# Patient Record
Sex: Male | Born: 1935 | Race: White | Hispanic: No | Marital: Married | Smoking: Never smoker
Health system: Southern US, Community
[De-identification: ages and names within clinical notes are randomized; demographics above are authoritative.]

## PROBLEM LIST (undated history)

## (undated) DIAGNOSIS — E079 Disorder of thyroid, unspecified: Secondary | ICD-10-CM

## (undated) DIAGNOSIS — C801 Malignant (primary) neoplasm, unspecified: Secondary | ICD-10-CM

## (undated) HISTORY — PX: THYROID SURGERY: SHX805

---

## 2000-05-09 ENCOUNTER — Encounter (INDEPENDENT_AMBULATORY_CARE_PROVIDER_SITE_OTHER): Payer: Self-pay | Admitting: Specialist

## 2000-05-09 ENCOUNTER — Other Ambulatory Visit: Admission: RE | Admit: 2000-05-09 | Discharge: 2000-05-09 | Payer: Self-pay | Admitting: Urology

## 2001-09-01 ENCOUNTER — Encounter (INDEPENDENT_AMBULATORY_CARE_PROVIDER_SITE_OTHER): Payer: Self-pay | Admitting: *Deleted

## 2001-09-01 ENCOUNTER — Inpatient Hospital Stay (HOSPITAL_COMMUNITY): Admission: EM | Admit: 2001-09-01 | Discharge: 2001-09-03 | Payer: Self-pay | Admitting: Emergency Medicine

## 2001-09-03 ENCOUNTER — Encounter (INDEPENDENT_AMBULATORY_CARE_PROVIDER_SITE_OTHER): Payer: Self-pay | Admitting: *Deleted

## 2002-05-24 ENCOUNTER — Encounter: Payer: Self-pay | Admitting: Ophthalmology

## 2002-05-28 ENCOUNTER — Ambulatory Visit (HOSPITAL_COMMUNITY): Admission: RE | Admit: 2002-05-28 | Discharge: 2002-05-28 | Payer: Self-pay | Admitting: Ophthalmology

## 2002-09-12 ENCOUNTER — Ambulatory Visit (HOSPITAL_COMMUNITY): Admission: RE | Admit: 2002-09-12 | Discharge: 2002-09-12 | Payer: Self-pay | Admitting: Internal Medicine

## 2004-07-06 ENCOUNTER — Inpatient Hospital Stay (HOSPITAL_COMMUNITY): Admission: RE | Admit: 2004-07-06 | Discharge: 2004-07-10 | Payer: Self-pay | Admitting: Urology

## 2004-07-06 ENCOUNTER — Encounter (INDEPENDENT_AMBULATORY_CARE_PROVIDER_SITE_OTHER): Payer: Self-pay | Admitting: *Deleted

## 2004-09-08 ENCOUNTER — Encounter: Admission: RE | Admit: 2004-09-08 | Discharge: 2004-09-08 | Payer: Self-pay | Admitting: Internal Medicine

## 2004-09-08 ENCOUNTER — Encounter (INDEPENDENT_AMBULATORY_CARE_PROVIDER_SITE_OTHER): Payer: Self-pay | Admitting: *Deleted

## 2004-09-10 ENCOUNTER — Ambulatory Visit: Payer: Self-pay | Admitting: Internal Medicine

## 2004-09-28 ENCOUNTER — Ambulatory Visit: Payer: Self-pay | Admitting: Internal Medicine

## 2005-06-09 ENCOUNTER — Encounter: Admission: RE | Admit: 2005-06-09 | Discharge: 2005-06-09 | Payer: Self-pay | Admitting: Internal Medicine

## 2005-06-09 ENCOUNTER — Encounter (INDEPENDENT_AMBULATORY_CARE_PROVIDER_SITE_OTHER): Payer: Self-pay | Admitting: *Deleted

## 2005-06-13 ENCOUNTER — Encounter: Admission: RE | Admit: 2005-06-13 | Discharge: 2005-06-13 | Payer: Self-pay | Admitting: Internal Medicine

## 2005-06-13 ENCOUNTER — Encounter (INDEPENDENT_AMBULATORY_CARE_PROVIDER_SITE_OTHER): Payer: Self-pay | Admitting: *Deleted

## 2009-08-21 ENCOUNTER — Encounter (INDEPENDENT_AMBULATORY_CARE_PROVIDER_SITE_OTHER): Payer: Self-pay | Admitting: *Deleted

## 2009-09-01 ENCOUNTER — Encounter (INDEPENDENT_AMBULATORY_CARE_PROVIDER_SITE_OTHER): Payer: Self-pay | Admitting: *Deleted

## 2009-10-06 DIAGNOSIS — K5730 Diverticulosis of large intestine without perforation or abscess without bleeding: Secondary | ICD-10-CM | POA: Insufficient documentation

## 2009-10-06 DIAGNOSIS — Z860100 Personal history of colon polyps, unspecified: Secondary | ICD-10-CM | POA: Insufficient documentation

## 2009-10-06 DIAGNOSIS — Z8719 Personal history of other diseases of the digestive system: Secondary | ICD-10-CM | POA: Insufficient documentation

## 2009-10-13 ENCOUNTER — Ambulatory Visit: Payer: Self-pay | Admitting: Internal Medicine

## 2009-10-15 ENCOUNTER — Ambulatory Visit: Payer: Self-pay | Admitting: Internal Medicine

## 2009-10-19 ENCOUNTER — Encounter: Payer: Self-pay | Admitting: Internal Medicine

## 2010-08-27 ENCOUNTER — Encounter
Admission: RE | Admit: 2010-08-27 | Discharge: 2010-08-27 | Payer: Self-pay | Source: Home / Self Care | Attending: Internal Medicine | Admitting: Internal Medicine

## 2010-09-16 NOTE — Procedures (Signed)
Summary: COLON   Colonoscopy  Procedure date:  09/28/2004  Findings:      Location:  [place].    Procedures Next Due Date:    Colonoscopy: 09/2007 Patient Name: Corey Small, Corey Small MRN: [id number] Procedure Procedures: Colonoscopy CPT: H7044205.    with polypectomy. CPT: S2983155.  Personnel: Endoscopist: Valma Rotenberg L. [name], MD.  Referred By: Lorene Dy, MD.  Exam Location: Exam performed in Outpatient Clinic. Outpatient  Patient Consent: Procedure, Alternatives, Risks and Benefits discussed, consent obtained, from patient. Consent was obtained by the RN.  Indications  Average Risk Screening Routine.  History  Current Medications: Patient is not currently taking Coumadin.  Pre-Exam Physical: Performed Sep 28, 2004. Entire physical exam was normal.  Exam Exam: Extent of exam reached: Cecum, extent intended: Cecum.  The cecum was identified by appendiceal orifice and IC valve. Colon retroflexion performed. Images taken. ASA Classification: II. Tolerance: good.  Monitoring: Pulse and BP monitoring, Oximetry used. Supplemental O2 given.  Colon Prep Used Miralax for colon prep. Prep results: good.  Sedation Meds: Patient assessed and found to be appropriate for moderate (conscious) sedation. Fentanyl 75 mcg. given IV. Versed 10 mg. given IV.  Findings - MULTIPLE POLYPS: Cecum to Hepatic Flexure. minimum size 6 mm, maximum size 9 mm. Procedure:  snare with cautery, removed, Polyp retrieved, 2 polyps Polyps sent to pathology. ICD9: Colon Polyps: 211.3.  - DIVERTICULOSIS: Sigmoid Colon. ICD9: Diverticulosis: 562.10. Comments: severe diveric ,no obstruction.  - NORMAL EXAM: Cecum.   Assessment Abnormal examination, see findings above.  Diagnoses: 211.3: Colon Polyps.  562.10: Diverticulosis.   Comments: s/p polypectomy times two Events  Unplanned Interventions: No intervention was required.  Unplanned Events: There were no  complications. Plans  Post Exam Instructions: No aspirin or non-steroidal containing medications: 2 weeks.  Medication Plan: Await pathology.  Patient Education: Patient given standard instructions for: Yearly hemoccult testing recommended. Patient instructed to get routine colonoscopy every 3 years.  Disposition: After procedure patient sent to recovery. After recovery patient sent home.   This report was created from the original endoscopy report, which was reviewed and signed by the above listed endoscopist.

## 2010-09-16 NOTE — Procedures (Signed)
Summary: Colonoscopy  Patient: [name] Note: All result statuses are Final unless otherwise noted.  Tests: (1) Colonoscopy (COL)   COL Colonoscopy           [facility]     [place], [place]  [address]           COLONOSCOPY PROCEDURE REPORT           PATIENT:  Corey Small, Corey Small  MR#:  [id number]     BIRTHDATE:  Oct 13, 1919, 89 yrs. old  GENDER:  male           ENDOSCOPIST:  Lowella Bandy. [name], MD     Referred by:  Lorene Dy, M.D.           PROCEDURE DATE:  10/15/2009     PROCEDURE:  Colonoscopy 45378     ASA CLASS:  Class III     INDICATIONS:  aden. polyp with high grade dysplasia 2006     no polyps in 2003 x diverticulosis           MEDICATIONS:   Versed 5 mg, Fentanyl 50 mcg           DESCRIPTION OF PROCEDURE:   After the risks benefits and     alternatives of the procedure were thoroughly explained, informed     consent was obtained.  Digital rectal exam was performed and     revealed no rectal masses.   The LB PCF-H180AL [id number] endoscope     was introduced through the anus and advanced to the cecum, which     was identified by both the appendix and ileocecal valve, without     limitations.  The quality of the prep was good, using MiraLax.     The instrument was then slowly withdrawn as the colon was fully     examined.     <<PROCEDUREIMAGES>>           FINDINGS:  A diminutive polyp was found in the sigmoid colon. at     30 cm small flat polyp The polyp was removed using cold biopsy     forceps (see image7).  Moderate diverticulosis was found     throughout the colon (see image6, image2, and image1).  This was     otherwise a normal examination of the colon (see image3, image4,     image5, and image8).   Retroflexed views in the rectum revealed no     abnormalities.    The scope was then withdrawn from the patient     and the procedure completed.           COMPLICATIONS:  None           ENDOSCOPIC IMPRESSION:     1)  Diminutive polyp in the sigmoid colon     2) Moderate diverticulosis throughout the colon     3) Otherwise normal examination     RECOMMENDATIONS:     1) Await pathology results     high fiber diet           REPEAT EXAM:  In 0 year(s) for.  no recall due to age           ______________________________     Lowella Bandy. [name], MD           CC:           n.     eSIGNED:   Lowella Bandy. [name]  at 10/15/2009 03:32 PM           Gearldine Bienenstock, [id number]  Note: An exclamation mark (!) indicates a result that was not dispersed into the flowsheet. Document Creation Date: 10/15/2009 3:32 PM _______________________________________________________________________  (1) Order result status: Final Collection or observation date-time: 10/15/2009 15:25 Requested date-time:  Receipt date-time:  Reported date-time:  Referring Physician:   Ordering Physician: Delfin Edis [phone]) Specimen Source:  Source: Tawanna Cooler Order Number: [id number] Lab site:

## 2010-09-16 NOTE — Op Note (Signed)
Summary: COLON                    [place]. [facility]  Patient:    Corey Small, Corey Small Visit Number: [id number] MRN: [id number]          Service Type: MED Location: [phone] Attending Physician:  Vincent Peyer Dictated by:   Lowella Bandy. [name], M.D. [facility] Proc. Date: 09/03/01 Admit Date:  09/01/2001 Discharge Date: 09/03/2001   CC:         Floyde Parkins, M.D.   Procedure Report  PROCEDURE: Colonoscopy.  ENDOSCOPIST: Lowella Bandy. [name], M.D.  INDICATIONS FOR PROCEDURE: This 77 year old gentleman was admitted two days ago with large volume hematochezia.  He has been on one aspirin a day and has no history of GI problems.  His hemoglobin initially was 16 and dropped down to 14.  He has not had any bleeding in the last 24 hours and was prepped for colonoscopy for further evaluation of the large volume hematochezia.  ENDOSCOPE: Fujinon single-channel video scope.  SEDATION: Versed 5 mg IV, Fentanyl 50 mg IV.  FINDINGS: The Fujinon single-channel video scope was passed under direct vision through the rectum to the sigmoid colon.  The patient was monitored by pulse oximetry and oxygen saturations were normal.  His prep was adequate. The anal canal and rectal ampulla was unremarkable.  Starting at 20 cm from the rectum was extensive diverticulosis with large hypertrophic folds, multiple diverticula.  One of them showed old blood impacted within the diverticulum.  There were also streaks of old blood in the stool that was left behind in the sigmoid colon.  No bright red blood was noted in the entire colon.  The splenic flexure, transverse colon, hepatic flexure, and ascending colon were free of any blood or any mucosal lesions.  The cecal pouch was normal.  The colonoscope was then retracted and the colon decompressed.  No other lesions were found.  IMPRESSION: Moderately severe diverticulosis of the left colon, status post suspected diverticular bleed from the  left colon.  PLAN:  1. Advance diet.  2. Follow H&H closely.  3. Follow-up with Dr. Mancel Bale.  4. The patient will be discharged and will start on Citrucel one teaspoonful     on a daily basis. Dictated by:   Lowella Bandy. [name], M.D. [place] Attending Physician:  Vincent Peyer DD:  09/03/01 TD:  09/04/01 Job: [id number]   This report was created from the original endoscopy report, which was reviewed and signed by the above listed endoscopist.

## 2010-09-16 NOTE — Letter (Signed)
Summary: [facility] Instructions  [facility]  [place], [place] [address]   Phone: [phone]  Fax: [phone]       Corey Small    [birth year]    MRN: [id number]       Procedure Day /Date: 10/15/09 (Thursday)     Arrival Time: 1:30 pm     Procedure Time: 2:30 pm     Location of Procedure:                    _x _  [place] (4th Floor)  [place]  Starting 5 days prior to your procedure (today) do not eat nuts, seeds, popcorn, corn, beans, peas,  salads, or any raw vegetables.  Do not take any fiber supplements (e.g. Metamucil, Citrucel, and Benefiber). ____________________________________________________________________________________________________   THE DAY BEFORE YOUR PROCEDURE         DATE: 3/2/11DAY: Wednesday  1   Drink clear liquids the entire day-NO SOLID FOOD  2   Do not drink anything colored red or purple.  Avoid juices with pulp.  No orange juice.  3   Drink at least 64 oz. (8 glasses) of fluid/clear liquids during th e day to prevent dehydration and help the prep work efficiently.  CLEAR LIQUIDS INCLUDE: Water Jello Ice Popsicles Tea (sugar ok, no milk/cream) Powdered fruit flavored drinks Coffee (sugar ok, no milk/cream) Gatorade Juice: apple, white grape, white cranberry  Lemonade Clear bullion, consomm, broth Carbonated beverages (any kind) Strained chicken noodle soup Hard Candy  4   Mix the entire bottle of Miralax with 64 oz. of Gatorade/Powerade in the morning and put in the refrigerator to chill.  5   At 3:00 pm take 2 Dulcolax/Bisacodyl tablets.  6   At 4:30 pm take one Reglan/Metoclopramide tablet.  7  Starting at 5:00 pm drink one 8 oz glass of the Miralax mixture every 15-20 minutes until you have finished drinking the entire 64 oz.  You should finish drinking prep around 7:30 or 8:00 pm.  8   If you are nauseated, you may take the 2nd Reglan/Metoclopramide  tablet at 6:30 pm.        9    At 8:00 pm take 2 more DULCOLAX/Bisacodyl tablets.        THE DAY OF YOUR PROCEDURE      DATE:  10/15/09 DAY: Wednesday  You may drink clear liquids until 12:30 pm  (2 HOURS BEFORE PROCEDURE).   MEDICATION INSTRUCTIONS  Unless otherwise instructed, you should take regular prescription medications with a small sip of water as early as possible the morning of your procedure.        OTHER INSTRUCTIONS  You will need a responsible adult at least 75 years of age to accompany you and drive you home.   This person must remain in the waiting room during your procedure.  Wear loose fitting clothing that is easily removed.  Leave jewelry and other valuables at home.  However, you may wish to bring a book to read or an iPod/MP3 player to listen to music as you wait for your procedure to start.  Remove all body piercing jewelry and leave at home.  Total time from sign-in until discharge is approximately 2-3 hours.  You should go home directly after your procedure and rest.  You can resume normal activities the day after your procedure.  The day of your procedure you should not:   Drive   Make  legal decisions   Operate machinery   Drink alcohol   Return to work  You will receive specific instructions about eating, activities and medications before you leave.   The above instructions have been reviewed and explained to me by  [place] Deborra Medina)  October 13, 2009 3:21 PM     I fully understand and can verbalize these instructions _____________________________ Date 10/13/09

## 2010-09-16 NOTE — Discharge Summary (Signed)
Summary: GI Bleed                    [place]. [facility]  Patient:    Corey Small, Corey Small Visit Number: [id number] MRN: [id number]          Service Type: MED Location: [phone] Attending Physician:  Vincent Peyer Dictated by:   Vena Rua, P.A. Admit Date:  09/01/2001 Discharge Date: 09/03/2001                             Discharge Summary  ADMISSION DIAGNOSES: 1. Acute lower gastrointestinal bleeding with bright red bleeding per rectum    for eight hours prior to admission. Rule out diverticular bleed, rule out    occult colonic neoplasm, rule out angiodysplasia of the colon. 2. History of prostate cancer diagnosed in 2002.  Followed by Einar Crow, M.D.  According to the patient, there has been no treatment for    the cancer, he is just being followed closely. 3. Status post appendectomy. 4. Status post umbilical hernia repair.  DISCHARGE DIAGNOSIS:  Acute lower gastrointestinal bleeding secondary to diverticulosis, predominantly involving the left colon.  This is felt to be the source for his bleeding.  Bleeding resolved during this hospitalization.  CONSULTATIONS:  None.  PROCEDURES:  Colonoscopy by Dr. Delfin Edis performed on September 03, 2001. This showed extensive diverticulosis involving the sigmoid colon and into the rectosigmoid region.  Old blood was seen within a particular diverticulum. There were no masses or polyps observed.  BRIEF HISTORY:  Corey Small is a pleasant, 77 year old white gentleman.  Dr. Lindaann Slough requested that GI Service admit the patient because of complaints of bright red bleeding per rectum which had begun about eight hours prior to arrival in the emergency room.  Dr. Larena Sox Collene Mares was covering for Dr. Delfin Edis and admitted the patient.  Apparently, the bleeding had started about 12 p.m. on the day of admission.  He had had a single episode of bleeding associated with clots and fresh  blood.  He did not have any associated nausea, vomiting, abdominal pain, fever or chills.  He has never seen rectal bleeding before.  The patient had no other constitutional symptoms suggestive of hemodynamic instability such as chest pain, shortness of breath, fatigue or weakness.  Dr. Collene Mares admitted the patient from the emergency room in stable condition with a blood pressure of 158/93 and a pulse of 86.  Initial hemoglobin in the ER was 16.2 with normal MCV and normal coags.  LABORATORY DATA:  Hemoglobin was 16.2 on admission, it declined to 14.6 on the day of discharge. Hematocrit went from 46.1 down to 41.4.  MCV was normal at 89.9.  Platelets ranged 182 and 211.  PT was 13.9, INR 1.1, PTT 35.  Sodium 137, potassium 4.0, chloride 108, CO2 24, glucose 130, BUN 20, creatinine 1.3. Total bilirubin 0.8, alkaline phosphatase 67, AST 21, ALT 21.  Albumin was 3.7.  HOSPITAL COURSE:  Within the first 24 hours of his admission the patient had not had any further bleeding and continued to be pain and nausea free.  By hospital day three, he had completed colonoscopy prep.  This was associated with passage of some old blood no further fresh bleeding.  He underwent colonoscopy with findings noted above.  Following the colonoscopy, he was felt stable for discharge and went home in the company  of his wife in stable condition.  Follow up plans for this patient were to see Dr. Mancel Bale for his usual annual physical exam.  He was advised not to use any aspirin for two weeks. Also added to his regimen were Citrucel one teaspoon mixed with liquid daily.  In two weeks, he would be allowed to start back on aspirin 81 mg p.o. q.d. Next, if he had any further bleeding problems he was to contact Dr. Josepha Pigg office or proceed to the emergency room.  Dictated by:   Vena Rua, P.A. Attending Physician:  Vincent Peyer DD:  09/21/01 TD:  09/23/01 Job: [id number]

## 2010-09-16 NOTE — Letter (Signed)
Summary: Colonoscopy Letter  [facility]  [place], [place] [address]   Phone: [phone]  Fax: [phone]      August 21, 2009 MRN: [id number]   Corey Small [place], [place]  [address]   Dear Mr. [name],   According to your medical record, it is time for you to schedule a Colonoscopy. The American Cancer Society recommends this procedure as a method to detect early colon cancer. Patients with a family history of colon cancer, or a personal history of colon polyps or inflammatory bowel disease are at increased risk.  This letter has beeen generated based on the recommendations made at the time of your procedure. If you feel that in your particular situation this may no longer apply, please contact our office.  Please call our office at [phone] to schedule this appointment or to update your records at your earliest convenience.  Thank you for cooperating with [place] to provide you with the very best care possible.   Sincerely,  Lowella Bandy. [name], M.D.  [facility] [phone]

## 2010-09-16 NOTE — Assessment & Plan Note (Signed)
Summary: consult before col pt age...em   History of Present Illness Visit Type: Initial Consult Primary GI MD: Delfin Edis MD Primary Provider: Cherlynn Polo Requesting Provider: Cherlynn Polo Chief Complaint: Consult for a colonoscopy , No GI complaints History of Present Illness:   This is an 77 year old white male who is due for a recall colonoscopy. He has a history of a tubular adenoma and another adenoma with focal high-grade dysplasia on a colonoscopy in February 2006. His first colonoscopy in January 2003 showed diverticulosis. There is a history of an acute lower GI bleed in January 2003 attributed to diverticula. Other medical problems include prostate cancer, horseshoe kidney stones, fatty liver and cholelithiasis.   GI Review of Systems      Denies abdominal pain, acid reflux, belching, bloating, chest pain, dysphagia with liquids, dysphagia with solids, heartburn, loss of appetite, nausea, vomiting, vomiting blood, weight loss, and  weight gain.        Denies anal fissure, black tarry stools, change in bowel habit, constipation, diarrhea, diverticulosis, fecal incontinence, heme positive stool, hemorrhoids, irritable bowel syndrome, jaundice, light color stool, liver problems, rectal bleeding, and  rectal pain.    Current Medications (verified): 1)  Avodart 0.5 Mg Caps (Dutasteride) .[facility] Kitchen.. 1 By Mouth Once Daily 2)  Triamterene-Hctz 50-25 Mg Caps (Triamterene-Hctz) .Marland Kitchen.. 1 By Mouth Once Daily 3)  Simvastatin 20 Mg Tabs (Simvastatin) .Marland Kitchen.. 1 By Mouth Once Daily 4)  Citrucel 500 Mg Tabs (Methylcellulose (Laxative)) .Marland Kitchen.. 1 By Mouth Once Daily 5)  Aspirin 81 Mg Tbec (Aspirin) .Marland Kitchen.. 1 By Mouth Once Daily 6)  Cephalexin 500 Mg Caps (Cephalexin) .Marland Kitchen.. 1 By Mouth Four Times A Day For 15 Days For Infection On Hand  Allergies (verified): No Known Drug Allergies  Past History:  Past Medical History: Current Problems:  GASTROINTESTINAL HEMORRHAGE, HX OF (ICD-V12.79) COLONIC  POLYPS, ADENOMATOUS, HX OF (ICD-V12.72) DIVERTICULOSIS OF COLON (ICD-562.10) Kidney Stones  Past Surgical History: Reviewed history from 10/06/2009 and no changes required. T & A appendectomy colon surgery for "kinked colon" age 9  Family History: Family History of Breast Cancer:mother No FH of Colon Cancer:  Social History: Illicit Drug Use - no married Occupation: retired 2 children Alcohol Use - no Patient has never smoked.  Smoking Status:  never  Review of Systems  The patient denies nausea, vomiting, hypoglycemia, palpitations, excessive diaphoresis, tremor, polyuria, erectile dysfunction, anxiety, fever, weight loss, weight gain, vision loss, hoarseness, chest pain, syncope, dyspnea on exertion, peripheral edema, prolonged cough, headaches, hemoptysis, abdominal pain, hematochezia, severe indigestion/heartburn, hematuria, incontinence, muscle weakness, suspicious skin lesions, depression, unusual weight change, angioedema, and breast masses.         Pertinent positive and negative review of systems were noted in the above HPI. All other ROS was otherwise negative.   Vital Signs:  Patient profile:   77 year old male Height:      71.5 inches Weight:      189 pounds BSA:     2.07 Pulse rate:   80 / minute Pulse rhythm:   irregular BP sitting:   118 / 78  (left arm)  Vitals Entered By: [place] Deborra Medina) (October 13, 2009 2:46 PM)  Physical Exam  General:  alert, oriented, hard of hearing. Eyes:  PERRLA, no icterus. Mouth:  No deformity or lesions, dentition normal. Neck:  Supple; no masses or thyromegaly. Lungs:  Clear throughout to auscultation. Heart:  quiet S1 and S2 with occasional premature beats. Abdomen:  soft abdomen with normoactive bowel  sounds. No tenderness. No palpable mass. No bruits. Liver edge at costal margin. Rectal:  normal rectal tone. 2+ prostate hard Hemoccult negative stool in large amount. Extremities:  No clubbing, cyanosis, edema  or deformities noted. Skin:  multiple actinic keratoses. Psych:  Alert and cooperative. Normal mood and affect.   Impression & Recommendations:  Problem # 1:  COLONIC POLYPS, ADENOMATOUS, HX OF (ICD-V12.72)  Patient had high-grade dysplasia in an adenomatous  colon polyp 5 years ago. She is hemoccult-negative on my exam today. He has constipation as indicated by a large amount of stool in the rectum. We will go ahead with a colonoscopy to rule out the possibility of recurrent polyps.  Orders: Colonoscopy (Colon)  Problem # 2:  GASTROINTESTINAL HEMORRHAGE, HX OF (ICD-V12.79)  Patient is status post diverticular bleed in 2003. She is currently Hemoccult negative.  Orders: Colonoscopy (Colon)  Patient Instructions: 1)  colonoscopy with MiraLax prep. Indications prep and sedation discussed with the patient. 2)  Copy sent to : Dr Suzan Garibaldi 3)  The medication list was reviewed and reconciled.  All changed / newly prescribed medications were explained.  A complete medication list was provided to the patient / caregiver. Prescriptions: DULCOLAX 5 MG  TBEC (BISACODYL) Day before procedure take 2 at 3pm and 2 at 8pm.  #4 x 0   Entered by:   Awilda Bill CMA ([place])   Authorized by:   [place] Dragon MD   Signed by:   Awilda Bill CMA ([place]) on 10/13/2009   Method used:   Electronically to        [facility] (retail[facility]       [address]       [place], [place]  [address]       Ph: [phone]       Fax: [phone]   RxID:   [id number] REGLAN 10 MG  TABS (METOCLOPRAMIDE HCL) As per prep instructions.  #2 x 0   Entered by:   Awilda Bill CMA (AAMA)   Authorized by:   Lafayette Dragon MD   Signed by:   Awilda Bill CMA ([place]) on 10/13/2009   Method used:   Electronically to        [facility] ([facility])       [address]       [place], [place]  [address]       Ph: [phone]       Fax: [phone]   RxID:    [id number] [place]   POWD (POLYETHYLENE GLYCOL 3350) As per prep  instructions.  #255gm x 0   Entered by:   Awilda Bill CMA ([place])   Authorized by:   Lafayette Dragon MD   Signed by:   Awilda Bill CMA ([place]) on 10/13/2009   Method used:   Electronically to        [facility] (retail)       [address]       [place], [place]  [address]       Ph: [phone]       Fax: [phone]   [place]:   [phone]

## 2010-09-16 NOTE — Letter (Signed)
Summary: New Patient letter  [facility]  [address] [place], [place] [address]   Phone: [phone]  Fax: [phone]       09/01/2009 MRN: [id number]  Corey Small [place], [place]  [address]  Dear Mr. Corey Small,  Welcome to the Gastroenterology Division at [facility].    You are scheduled to see Dr.  Delfin Edis on October 13, 2009 at 2:45pm on the 3rd floor at [facility].  We ask that you try to arrive at ouroffice 15 minutes prior to your appointment time to allow for check-in.  We would like you to complete the enclosed self-administered evaluation form prior to your visit and bring it with you on the day of your appointment.  We will review it with you.  Also, please bring a complete list of all your medications or, if you prefer, bring the medication bottles and we will list them.  Please bring your insurance card so that we may make a copy of it.  If your insurance requires a referral to see a specialist, please bring your referral form from your primary care physician.  Co-payments are due at the time of your visit and may be paid by cash, check or credit card.     Your office visit will consist of a consult with your physician (includes a physical exam), any laboratory testing he/she may order, scheduling of any necessary diagnostic testing (e.g. x-ray, ultrasound, CT-scan), and scheduling of a procedure (e.g. Endoscopy, Colonoscopy) if required.  Please allow enough time on your schedule to allow for any/all of these possibilities.    If you cannot keep your appointment, please call [phone] to cancel or reschedule prior to your appointment date.  This allows [place] the opportunity to schedule an appointment for another patient in need of care.  If you do not cancel or reschedule by 5 p.m. the business day prior to your appointment date, you will be charged a $50.00 late cancellation/no-show fee.    Thank you for  choosing [facility] for your medical needs.  We appreciate the opportunity to care for you.  Please visit [place] at our website  to learn more about our practice.                     Sincerely,                                                             The Gastroenterology Division

## 2010-09-16 NOTE — Letter (Signed)
Summary: Patient Notice- Polyp Results  [facility]  [address] [place], [place] [address]   Phone: [phone]  Fax: [phone]        October 19, 2009 MRN: [id number]    Corey Small [place], [place]  [address]    Dear Corey Small,  I am pleased to inform you that the colon polyp(s) removed during your recent colonoscopy was (were) found to be benign (no cancer detected) upon pathologic examination.The polyp was hyperplastic ( not precancerous)    Should you develop new or worsening symptoms of abdominal pain, bowel habit changes or bleeding from the rectum or bowels, please schedule an evaluation with either your primary care physician or with me.  Additional information/recommendations:  _x_ No further action with gastroenterology is needed at this time. Please      follow-up with your primary care physician for your other healthcare      needs.  __ Please call [phone] to schedule a return visit to review your      situation.  __ Please keep your follow-up visit as already scheduled.  __ Continue treatment plan as outlined the day of your exam.  Please call us if you are having persistent problems or have questions about your condition that have not been fully answered at this time.  Sincerely,  Lafayette Dragon MD  This letter has been electronically signed by your physician.  Appended Document: Patient Notice- Polyp Results Letter mailed 3.10.11

## 2010-12-31 NOTE — H&P (Signed)
[place]  Patient:    Corey Small, Corey Small Visit Number: [id number] MRN: [id number]          Service Type: MED Location: [phone] Attending Physician:  Vincent Peyer Dictated by:   Nelwyn Salisbury, M.D. Admit Date:  09/01/2001   CC:         Floyde Parkins, M.D.  Einar Crow, M.D.   History and Physical  DATE OF BIRTH:  19-Jun-2020  CHIEF COMPLAINT:  Bright red bleeding per rectum x 8 hours.  HISTORY OF PRESENT ILLNESS:  Mr. Corey Small is a very pleasant, 77 year old, white male admitted to the GI service at the request of Lindaann Slough, M.D., when he called him with complaints of bright red bleeding per rectum for the last eight hours.  The patient was in his usual state of health.  He was sitting on his couch at about 12 p.m. when he noticed a wet discharge from his rectum.  When he went to the bathroom, he saw some fresh clots and every few hours noticed the passage of clots without any abdominal pain, nausea, vomiting, fever, chills, or rigors.  He has never had symptoms like this before.  There was no history of syncope or near syncopal events, dizziness, fatigue, weakness, shortness of breath, chest pain, or palpitations.  The patient was concerned about the symptoms because of his previous diagnosis of prostate cancer.  He presented to the hospital for further evaluation.  The patient denies a history of any genitourinary complaints, except for a history of prostate cancer diagnosed about a year ago by Einar Crow, M.D.  According to the patient, no treatment was undertaken.  Plans were to watch him closely.  The patient claims that he had a sigmoidoscopy done by Floyde Parkins, M.D., five years ago, which he says was normal and no abnormalities were noticed.  There are no ENT, dental, or ocular complaints.  ALLERGIES:  No known drug allergies.  MEDICATIONS:  Baby aspirin one per  day.  PAST MEDICAL HISTORY:  Prostate cancer diagnosed a year ago.  PAST SURGICAL HISTORY:  Status post appendectomy and umbilical hernia repair as a teenager.  SOCIAL HISTORY:  The patient is married and lives with his wife in [place], [place].  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  He denies a history of alcohol, tobacco, or drug use.  FAMILY HISTORY:  His father committed suicide at 89.  His mother died of "liver cancer" at 84.  He has no other siblings.  The patient denies a known family history of prostate or colon cancer.  REVIEW OF SYSTEMS:  BRBPR without any abdominal pain, nausea, or vomiting.  PHYSICAL EXAMINATION:  An elderly white male lying comfortably on the stretcher in no acute distress.  VITAL SIGNS:  Blood pressure 158/93, pulse 86 per minute, respiratory rate 20, temperature 97.2 degrees.  HEENT:  PERRLA.  Facial symmetry preserved.  The patient is wearing glasses. Oropharyngeal mucosa without exudate.  NECK:  Supple.  No JVD, thyromegaly, or lymphadenopathy.  CHEST:  Clear to auscultation.  No rhonchi, rales, or wheezes.  HEART:  Regular rate and rhythm without murmurs, rubs, or gallops.  ABDOMEN:  Soft, nondistended, and nontender with normal bowel sounds.  A small surgical scar in the right lower quadrant and the umbilicus from the above-mentioned surgeries.  There is no evidence of hepatosplenomegaly.  RECTAL:  Moderate sphincter tone with a large amount of  fresh blood on the examining finger.  No other abnormalities are appreciated.  LABORATORY DATA ON ADMISSION:  Hemoglobin 16.6.  The CBC was otherwise normal. Sodium 137, potassium 4, chloride 108, CO2 24, BUN 20, creatinine 1.3, calcium 8.7, total protein 6, albumin 3.7, AST 21, ALT 21, alkaline phosphatase 68, total bilirubin 0.8.  The PT and PTT were within normal limits.  ASSESSMENT AND PLAN:  Acute lower gastrointestinal bleeding.  Question diverticular bleed.   The patient is being admitted to the hospital for serial CBCs.  He will be monitored closely and a colonoscopy done early next week. If the patient has significant GI hemorrhage prior to completion of his colonoscopy, a bleeding scan with an angiogram will be considered.  Aspirin will be held for now.  The patient will be maintained on ice chips and further recommendations made as the need arises. Dictated by:   Nelwyn Salisbury, M.D. Attending Physician:  Vincent Peyer DD:  09/02/01 TD:  09/02/01 Job: [id number]

## 2010-12-31 NOTE — Discharge Summary (Signed)
[place]  Patient:    Corey Small, Corey Small Visit Number: [id number] MRN: [id number]          Service Type: MED Location: [phone] Attending Physician:  Vincent Peyer Dictated by:   Vena Rua, P.A. Admit Date:  09/01/2001 Discharge Date: 09/03/2001                             Discharge Summary  ADMISSION DIAGNOSES: 1. Acute lower gastrointestinal bleeding with bright red bleeding per rectum    for eight hours prior to admission. Rule out diverticular bleed, rule out    occult colonic neoplasm, rule out angiodysplasia of the colon. 2. History of prostate cancer diagnosed in 2002.  Followed by Einar Crow, M.D.  According to the patient, there has been no treatment for    the cancer, he is just being followed closely. 3. Status post appendectomy. 4. Status post umbilical hernia repair.  DISCHARGE DIAGNOSIS:  Acute lower gastrointestinal bleeding secondary to diverticulosis, predominantly involving the left colon.  This is felt to be the source for his bleeding.  Bleeding resolved during this hospitalization.  CONSULTATIONS:  None.  PROCEDURES:  Colonoscopy by Dr. Delfin Edis performed on September 03, 2001. This showed extensive diverticulosis involving the sigmoid colon and into the rectosigmoid region.  Old blood was seen within a particular diverticulum. There were no masses or polyps observed.  BRIEF HISTORY:  Mr. Corey Small is a pleasant, 77 year old white gentleman.  Dr. Lindaann Slough requested that GI Service admit the patient because of complaints of bright red bleeding per rectum which had begun about eight hours prior to arrival in the emergency room.  Dr. Larena Sox Collene Mares was covering for Dr. Delfin Edis and admitted the patient.  Apparently, the bleeding had started about 12 p.m. on the day of admission.  He had had a single episode of bleeding associated with clots and fresh blood.  He did not have  any associated nausea, vomiting, abdominal pain, fever or chills.  He has never seen rectal bleeding before.  The patient had no other constitutional symptoms suggestive of hemodynamic instability such as chest pain, shortness of breath, fatigue or weakness.  Dr. Collene Mares admitted the patient from the emergency room in stable condition with a blood pressure of 158/93 and a pulse of 86.  Initial hemoglobin in the ER was 16.2 with normal MCV and normal coags.  LABORATORY DATA:  Hemoglobin was 16.2 on admission, it declined to 14.6 on the day of discharge. Hematocrit went from 46.1 down to 41.4.  MCV was normal at 89.9.  Platelets ranged 182 and 211.  PT was 13.9, INR 1.1, PTT 35.  Sodium 137, potassium 4.0, chloride 108, CO2 24, glucose 130, BUN 20, creatinine 1.3. Total bilirubin 0.8, alkaline phosphatase 67, AST 21, ALT 21.  Albumin was 3.7.  HOSPITAL COURSE:  Within the first 24 hours of his admission the patient had not had any further bleeding and continued to be pain and nausea free.  By hospital day three, he had completed colonoscopy prep.  This was associated with passage of some old blood no further fresh bleeding.  He underwent colonoscopy with findings noted above.  Following the colonoscopy, he was felt stable for discharge and went home in the company of his wife in stable condition.  Follow up plans for this patient were to see Dr. Mancel Bale for his usual annual  physical exam.  He was advised not to use any aspirin for two weeks. Also added to his regimen were Citrucel one teaspoon mixed with liquid daily.  In two weeks, he would be allowed to start back on aspirin 81 mg p.o. q.d. Next, if he had any further bleeding problems he was to contact Dr. Josepha Pigg office or proceed to the emergency room.  Dictated by:   Vena Rua, P.A. Attending Physician:  Vincent Peyer DD:  09/21/01 TD:  09/23/01 Job: [id number]

## 2010-12-31 NOTE — Op Note (Signed)
NAME:  Corey Small, Corey Small                         ACCOUNT NO.:  [id number]   MEDICAL RECORD NO.:  [id number]                   PATIENT TYPE:  OIB   LOCATION:                                       FACILITY:  [facility]   PHYSICIAN:  Garey Ham, M.D.             DATE OF BIRTH:  01-19-1920   DATE OF PROCEDURE:  05/28/2002  DATE OF DISCHARGE:                                 OPERATIVE REPORT   PREOPERATIVE DIAGNOSIS:  Senile cataract, left eye.   POSTOPERATIVE DIAGNOSIS:  Senile cataract, left eye.   NAME OF OPERATION:  Planned extracapsular cataract extraction -  phacoemulsification, primary insertion of posterior chamber intraocular lens  implant.   SURGEON:  Garey Ham, M.D.   ASSISTANT:  Nurse.   ANESTHESIA:  Local 4% Xylocaine, 0.75 % Marcaine retrobulbar with Wydase  added.  Anesthesia standby required in this elderly patient.  The patient  was given sodium Pentothal intravenously during the period of retrobulbar  injection.   DESCRIPTION OF PROCEDURE:  After the patient was prepped and draped a lid  speculum was inserted in the left eye.  The eye was turned downward and a  superior rectus traction suture placed.  Schiotz tonometry was recorded at 5-  7 scale units with a 5.5 gram weight.  A peritomy was performed adjacent to  the limbus from the 11 o'clock to 1 o'clock position.  The corneoscleral  junction was cleaned and a corneoscleral groove made with a 45-degree  Superblade.  The anterior chamber was then entered with a 2.5 mm diamond  keratome at the 12 o'clock position and the 15-degree blade at the 2:30  position.  Using a bent 26-gauge needle on a Healon syringe, a circular  capsulorhexis was begun and then completed with the Grabow forceps.  Hydrodissection and hydrodelineation were performed using 1% Xylocaine.  A  30-degree phakoemulsification tip was then inserted with slow, controlled  emulsification of the lens nucleus.  Total ultrasonic time:  One  minute one  second; average power level:  14%; total amount of fluid used:  40 mL.  Following removal of the nucleus the residual cortex was aspirated with the  irrigation aspiration tip.  The posterior capsule appeared intact with a  brilliant red fundus reflex.  It was therefore elected to insert an [employer] [id number] silicon three-piece posterior intraocular lens implant  with UB absorber, diopter strength +19.50.  This was inserted with the  McDonald forceps into the anterior chamber and then centered into the  capsular bag using the [facility] lens rotator.  The lens appeared to be well  centered.  The Healon which had been used throughout the procedure was  aspirated and replaced with balanced salt solution and Miochol ophthalmic  solution.  The operative incisions appeared to be self-sealing and no  sutures were required.  Maxitrol ointment was instilled in the conjunctival  cul-de-sac and a  light patch and protector shield applied.  Duration of  procedure and anesthesia administration was 45 minutes.  The patient  tolerated the procedure well in general, left the operating room for the  recovery room in good condition.                                               Garey Ham, M.D.    HNJ/MEDQ  D:  09/30/2002  T:  09/30/2002  Job:  [id number]

## 2010-12-31 NOTE — H&P (Signed)
NAME:  Corey Small, Corey Small                         ACCOUNT NO.:  [id number]   MEDICAL RECORD NO.:  [id number]                   PATIENT TYPE:  OIB   LOCATION:                                       FACILITY:  [facility]   PHYSICIAN:  Garey Ham, M.D.             DATE OF BIRTH:  1919-10-10   DATE OF ADMISSION:  05/28/2002  DATE OF DISCHARGE:                                HISTORY & PHYSICAL   REASON FOR ADMISSION:  This was a planned outpatient surgical admission of  this 77 year old white male admitted for cataract implant surgery of the  left eye.   PRESENT ILLNESS:  This patient has been followed in my office since  April 18, 2002.  At that time the patient stated he had poor vision and  very dim vision.  He had been previously told that he had a cataract of the  left eye.  Initial examination in my office revealed a visual acuity of  20/30 +2 right eye, 20/60 left eye without correction; and 20/30 +3 right  eye, 20/20 -2 left eye with correction.  Applanation tonometry 19 mm each  eye.  Slit lamp examination confirmed the presence of a dense white cloudy  cataract in the left eye despite his good vision.  Glare testing revealed a  visual acuity of 20/150 right eye, 20/400 left eye, indicating intolerance  to glare and/or poor night vision.  A scan ultrasonography was performed in  anticipation of surgery, considering the patient's complaints of blurred,  smoky, and dim vision; difficulty with driving, seeing road signs;  difficulty with television; difficulty with reading, night vision, bright  sunlight; inability to do work or housework, hobbies; colors appeared dim  and dull; and depth perception difficult or poor.  The patient was given  oral discussion and printed information concerning cataract surgery and its  complications.  He signed an informed consent and arrangements were made for  his outpatient admission.   PAST MEDICAL HISTORY:  The patient is under the care of  Dr. Janeice Robinson.  Has a history of prostate cancer, stabilized, and is in at the present good  general health, taking only 81 mg aspirin a day and Citracal.   REVIEW OF SYSTEMS:  No cardiorespiratory complaints.   PHYSICAL EXAMINATION:  VITAL SIGNS AS RECORDED ON ADMISSION:  Blood pressure  155/86, temperature 97.4, pulse 60, respirations 16.  GENERAL APPEARANCE:  The patient is a pleasant, well-nourished, well-  developed 77 year old white male in no acute distress.  HEENT:  Eyes:  Ocular exam as noted above.  Cataract formation greater in  the left than right eye.  Fundus examination normal.  Applanation tonometry  normal.  CHEST:  Lungs clear to percussion and auscultation.  HEART:  Normal sinus rhythm, no cardiomegaly, no murmurs.  ABDOMEN:  Negative.  EXTREMITIES:  Negative.   ADMISSION DIAGNOSIS:  Senile cataract, left eye.   SURGICAL PLAN:  Cataract implant surgery, left eye.                                               Garey Ham, M.D.   HNJ/MEDQ  D:  09/30/2002  T:  09/30/2002  Job:  [id number]

## 2010-12-31 NOTE — Procedures (Signed)
[place]  Patient:    Corey Small, HIDAY Visit Number: [id number] MRN: [id number]          Service Type: MED Location: [phone] Attending Physician:  Vincent Peyer Dictated by:   Lowella Bandy. [name], M.D. [facility] Proc. Date: 09/03/01 Admit Date:  09/01/2001 Discharge Date: 09/03/2001   CC:         Floyde Parkins, M.D.   Procedure Report  PROCEDURE: Colonoscopy.  ENDOSCOPIST: Lowella Bandy. [name], M.D.  INDICATIONS FOR PROCEDURE: This 77 year old gentleman was admitted two days ago with large volume hematochezia.  He has been on one aspirin a day and has no history of GI problems.  His hemoglobin initially was 16 and dropped down to 14.  He has not had any bleeding in the last 24 hours and was prepped for colonoscopy for further evaluation of the large volume hematochezia.  ENDOSCOPE: Fujinon single-channel video scope.  SEDATION: Versed 5 mg IV, Fentanyl 50 mg IV.  FINDINGS: The Fujinon single-channel video scope was passed under direct vision through the rectum to the sigmoid colon.  The patient was monitored by pulse oximetry and oxygen saturations were normal.  His prep was adequate. The anal canal and rectal ampulla was unremarkable.  Starting at 20 cm from the rectum was extensive diverticulosis with large hypertrophic folds, multiple diverticula.  One of them showed old blood impacted within the diverticulum.  There were also streaks of old blood in the stool that was left behind in the sigmoid colon.  No bright red blood was noted in the entire colon.  The splenic flexure, transverse colon, hepatic flexure, and ascending colon were free of any blood or any mucosal lesions.  The cecal pouch was normal.  The colonoscope was then retracted and the colon decompressed.  No other lesions were found.  IMPRESSION: Moderately severe diverticulosis of the left colon, status post suspected diverticular bleed from the left  colon.  PLAN:  1. Advance diet.  2. Follow H&H closely.  3. Follow-up with Dr. Mancel Bale.  4. The patient will be discharged and will start on Citrucel one teaspoonful     on a daily basis. Dictated by:   Lowella Bandy. [name], M.D. [place] Attending Physician:  Vincent Peyer DD:  09/03/01 TD:  09/04/01 Job: [id number]

## 2010-12-31 NOTE — Op Note (Signed)
NAME:  Corey Small, Corey Small NO.:  [id number]   MEDICAL RECORD NO.:  [id number]          PATIENT TYPE:  INP   LOCATION:  X005                         FACILITY:  [facility]   PHYSICIAN:  Nelida Gores, M.D.DATE OF BIRTH:  1920-02-06   DATE OF PROCEDURE:  07/06/2004  DATE OF DISCHARGE:                                 OPERATIVE REPORT   PREOPERATIVE DIAGNOSIS:  Low volume prostate cancer, mild obstructive  uropathy with bladder calculi.   POSTOPERATIVE DIAGNOSIS:  Low volume prostate cancer, mild obstructive  uropathy with bladder calculi.   PROCEDURE:  Cystoscopy, cystolithopexy, dilation of urethral stricture, and  placement of Foley catheter.   ANESTHESIA:  General.   DRAINS:  A 20 French Foley.   SPECIMENS:  Bladder stones.   COMPLICATIONS:  None.   DESCRIPTION OF PROCEDURE:  Patient was prepped and draped in the dorsal  lithotomy position after institution of an adequate level of general  anesthesia.  A well-lubricated 21 French pan endoscope was gently inserted  at the urethral meatus.  Normal penile urethra.  Some narrowing of the  bulbous urethra, which would, with careful manipulation admit the 21 French  cystoscope.  Prostatic urethra showed coapting lateral lobes.  Bladder  showed trabeculation with at least 6-8 stones within the bladder.  With  gentle irrigation over a period of probably 15-20 minutes, all stones were  irrigated from the bladder.  A 21 French cystoscope was then removed.  Attempts were made to dilate the urethra to allow passage of the 27 French  continuous-flow resectoscope sheath.  Narrowing was encountered along the  bulbous urethra, making passage to the [employer] sounds difficult.  Once  narrowed area had been dilated, the pan-endoscope was reinserted.  It was  felt that placement of a the 27 [place] continuous flow sheath might cause  some additional trauma to the bulbous urethra, and in order to avoid any  chance of stricture, the  cystoscope was removed and replaced with a 20  French council-tip catheter, which passed easily into the bladder.  Then 10  cc and a 5 cc ballon was left to straight.  There was some bleeding around  the catheter at the urethral meatus, but blood from the bladder was clear.  The catheter was left at light traction.  Patient was returned to recovery  in satisfactory condition.      RH/MEDQ  D:  07/06/2004  T:  07/06/2004  Job:  [id number]   cc:   Dr. Mancel Bale

## 2010-12-31 NOTE — Discharge Summary (Signed)
NAME:  DERWARD, FRISELLA NO.:  [id number]   MEDICAL RECORD NO.:  [id number]          PATIENT TYPE:  INP   LOCATION:  0369                         FACILITY:  [facility]   PHYSICIAN:  Nelida Gores, M.D.DATE OF BIRTH:  April 02, 1920   DATE OF ADMISSION:  07/06/2004  DATE OF DISCHARGE:  07/10/2004                                 DISCHARGE SUMMARY   Indications, medications, allergies, tobacco, ETOH, past medical history,  social history, physical exam, and review of systems all outlined in the  admitting note.   HOSPITAL COURSE:  The patient was admitted July 06, 2004, underwent  cystoscopy, cystolithopaxy, dilation of urethral stricture, and 20 [place]  Council-tip catheter placement.  The patient had a prolonged but pleasantly  uneventful postoperative recovery.  Urine was pink without clots on postop  day one.  Initial voiding trial July 08, 2004, the patient unable to  void, had some problems with constipation which may be related to previous  symptoms in that regard and patient's age.  Decision made on July 10, 2004, once bowel habits had returned to normal, to discharge patient home  with Foley catheter with follow up office visit July 12, 2004.  The  patient felt that he would do better with a office voiding trial.  Discharge medications included Macrobid and Vicodin.      RH/MEDQ  D:  08/09/2004  T:  08/09/2004  Job:  [id number]

## 2010-12-31 NOTE — H&P (Signed)
NAME:  LAQUANN, ISHO NO.:  [id number]   MEDICAL RECORD NO.:  [id number]          PATIENT TYPE:  INP   LOCATION:  X005                         FACILITY:  [facility]   PHYSICIAN:  Nelida Gores, M.D.DATE OF BIRTH:  04-Oct-1919   DATE OF ADMISSION:  07/06/2004  DATE OF DISCHARGE:                                HISTORY & PHYSICAL   An 77 year old male admitted for a cystolithopaxy and probable TURP.  These  are the notable points of the recent past medical history.   1.  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  PSA at that time was 6.6.  Careful follow-up of his      PSA over the last four to five years shows a range from 6.6 to 5.3 to      6.3 to 4.6 to 5.0 to 4.4 to 5.3 to 6.1.  Rectal exam shows subtle      induration in the left lobe of the prostate, but overall conservative      therapy seems to be working well.  2.  Progressive symptoms of urinary hesitancy, onset of hematuria, probable      hemorrhagic cystitis.  Cystoscopy June 22, 2004, six or perhaps eight      stones within the bladder.  The bladder shows no evidence of tumor or      other obvious abnormality.  There are coapting lateral lobes of the      prostate.  CT scan was performed June 22, 2004.  Findings included:      Shrunken gallbladder, multiple gallstones, horseshoe kidney, a 38 mm      stone within a calyceal diverticulum, a 34 mm cyst midportion of the      patient's horseshoe left kidney.  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  Discussed the situation with      patient and family members.  Am inclined to proceed with evacuation of      stones from the bladder and TURP if possible.  If not, would consider      medical therapy to shrink the  prostate, as the patient has had fairly      successful course over the last four to five years with watchful waiting      as regards his low-volume prostate cancer.   MEDICATIONS:  Citrucel two p.o. q.a.m.  The patient discontinued aspirin  about a week ago.   ALLERGIES:  Denies.   Tobacco:  Denies.  ETOH:  Denies.   PAST MEDICAL HISTORY:  1.  Umbilical hernia repair.  2.  Appendectomy.  3.  Symptomatic diverticulosis, January 2003, with GI bleed.  4.  Left cataract, October 2003.  5.  The patient has been followed with low-volume prostate cancer for four      to five years, watchful  waiting, with no significant change in his      PSA.   PHYSICAL EXAMINATION:  GENERAL:  A healthy, active, and alert 77 year old  male appearing younger than his stated age.  VITAL SIGNS:  Temperature 98, heart rate 64, respirations 20, BP 140/70.  Height 70 inches, weight 202 pounds.  HEENT:  Negative adenopathy or bruit on HEENT.  CHEST:  Lungs show slightly increased AP diameter with decreased breath  sounds at base, otherwise clear to P&A.  CARDIAC:  Regular rate and rhythm without murmur or gallop.  ABDOMEN:  Soft, protuberant, positive bowel sounds.  A well-healed  transverse appendectomy incision.  Well-healed umbilical hernia.  The  patient does have palpable inguinal hernias that are small and at present  cause him no significant discomfort.  Abdominal exam otherwise no  organomegaly or mass.  GENITOURINARY:  Penis without lesions, easily retractable foreskin.  Bilaterally descended, palpably atrophic testes.  RECTAL:  Induration within the left lobe.  NEUROLOGIC:  Grossly intact.   PLAN:  Will proceed this a.m.      RH/MEDQ  D:  07/06/2004  T:  07/06/2004  Job:  [id number]   cc:   Floyde Parkins, M.D.  [address], [place]  [place] [address]  Fax: [phone]

## 2010-12-31 NOTE — H&P (Signed)
   NAME:  Corey Small, Corey Small NO.:  [id number]   MEDICAL RECORD NO.:  [id number]                   PATIENT TYPE:  OIB   LOCATION:  2874                                 FACILITY:  [facility]   PHYSICIAN:  Garey Ham, M.D.             DATE OF BIRTH:  03/04/1929   DATE OF ADMISSION:  05/28/2002  DATE OF DISCHARGE:  05/28/2002                                HISTORY & PHYSICAL   DATE OF OUTPATIENT SURGERY:  05/28/2002.   This was a planned, outpatient surgical admission of this 77 year old, white  male, admitted for cataract implant surgery of the left eye.   The patient was first seen in my office in 04/18/02.  The patient stated at  that time that he had cataract in his left eye.  Examination revealed a  dense, cloudy, white cataract in the left eye, although the vision was  recorded without correction at 20/30 right eye, 20/60 left eye; with  correction 20/20 right eye, 20/30 left eye.  Glare testing; however,  revealed marked reduction to 20/400 in the left eye, accounting for the  patient's subjective symptoms.  It was felt that the patient had an almost  mature cataract with good vision due to his anterior subcapsular cataract  and nuclear cataract location.  It was felt feasible to proceed with  cataract surgery.  The patient was given oral discussion and printed  information concerning the procedure and its possible complications.  He  signed an informed consent and arrangements were made for his outpatient  admission at this time.   PAST MEDICAL HISTORY:  The patient is in stable general health, under the  care of Dr. Janeice Robinson.   CURRENT MEDICATIONS:  Include 81 mg of aspirin a day and Citrucel.   He is said to have had previous prostate cancer which is now stabilized.   PHYSICAL EXAMINATION:  VITAL SIGNS:  Blood pressure 155/86, temperature  97.4, pulse 60, respirations 16.  GENERAL APPEARANCE:  Patient is a pleasant, well-nourished,  well-developed,  77 year old white male in no acute distress.  HEENT:  Eyes, ocular exam is as noted above.  CHEST:  Lungs clear to percussion and auscultation.  HEART:  Normal sinus rhythm, no cardiomegaly, no murmurs.  ABDOMEN:  Negative.  EXTREMITIES:  Negative.   ADMISSION DIAGNOSIS:  Senile cataract, anterior subcapsular type, left eye.   SURGICAL PLAN:  Cataract implant surgery, left eye.                                               Garey Ham, M.D.    HNJ/MEDQ  D:  07/09/2002  T:  07/09/2002  Job:  [id number]

## 2013-08-16 ENCOUNTER — Emergency Department (HOSPITAL_BASED_OUTPATIENT_CLINIC_OR_DEPARTMENT_OTHER): Payer: Medicare Other

## 2013-08-16 ENCOUNTER — Encounter (HOSPITAL_BASED_OUTPATIENT_CLINIC_OR_DEPARTMENT_OTHER): Payer: Self-pay | Admitting: Emergency Medicine

## 2013-08-16 ENCOUNTER — Emergency Department (HOSPITAL_BASED_OUTPATIENT_CLINIC_OR_DEPARTMENT_OTHER)
Admission: EM | Admit: 2013-08-16 | Discharge: 2013-08-16 | Disposition: A | Discharge status: Home / Self Care | Payer: Medicare Other | Attending: Emergency Medicine | Admitting: Emergency Medicine

## 2013-08-16 DIAGNOSIS — J209 Acute bronchitis, unspecified: Secondary | ICD-10-CM | POA: Insufficient documentation

## 2013-08-16 DIAGNOSIS — Z862 Personal history of diseases of the blood and blood-forming organs and certain disorders involving the immune mechanism: Secondary | ICD-10-CM | POA: Insufficient documentation

## 2013-08-16 DIAGNOSIS — Z859 Personal history of malignant neoplasm, unspecified: Secondary | ICD-10-CM | POA: Insufficient documentation

## 2013-08-16 DIAGNOSIS — J029 Acute pharyngitis, unspecified: Secondary | ICD-10-CM | POA: Insufficient documentation

## 2013-08-16 DIAGNOSIS — Z7982 Long term (current) use of aspirin: Secondary | ICD-10-CM | POA: Insufficient documentation

## 2013-08-16 DIAGNOSIS — Z8639 Personal history of other endocrine, nutritional and metabolic disease: Secondary | ICD-10-CM | POA: Insufficient documentation

## 2013-08-16 DIAGNOSIS — Z79899 Other long term (current) drug therapy: Secondary | ICD-10-CM | POA: Insufficient documentation

## 2013-08-16 DIAGNOSIS — J3489 Other specified disorders of nose and nasal sinuses: Secondary | ICD-10-CM | POA: Insufficient documentation

## 2013-08-16 DIAGNOSIS — I10 Essential (primary) hypertension: Secondary | ICD-10-CM | POA: Insufficient documentation

## 2013-08-16 HISTORY — DX: Disorder of thyroid, unspecified: E07.9

## 2013-08-16 HISTORY — DX: Malignant (primary) neoplasm, unspecified: C80.1

## 2013-08-16 LAB — CBC WITH DIFFERENTIAL/PLATELET
Basophils Absolute: 0 10*3/uL (ref 0.0–0.1)
Basophils Relative: 0 % (ref 0–1)
Eosinophils Absolute: 0 10*3/uL (ref 0.0–0.7)
Eosinophils Relative: 0 % (ref 0–5)
HCT: 42.8 % (ref 39.0–52.0)
Hemoglobin: 14.3 g/dL (ref 13.0–17.0)
Lymphocytes Relative: 6 % — ABNORMAL LOW (ref 12–46)
Lymphs Abs: 0.5 10*3/uL — ABNORMAL LOW (ref 0.7–4.0)
MCH: 31.7 pg (ref 26.0–34.0)
MCHC: 33.4 g/dL (ref 30.0–36.0)
MCV: 94.9 fL (ref 78.0–100.0)
MONO ABS: 1.2 10*3/uL — AB (ref 0.1–1.0)
Monocytes Relative: 13 % — ABNORMAL HIGH (ref 3–12)
Neutro Abs: 7.3 10*3/uL (ref 1.7–7.7)
Neutrophils Relative %: 81 % — ABNORMAL HIGH (ref 43–77)
PLATELETS: 118 10*3/uL — AB (ref 150–400)
RBC: 4.51 MIL/uL (ref 4.22–5.81)
RDW: 14.1 % (ref 11.5–15.5)
WBC: 9 10*3/uL (ref 4.0–10.5)

## 2013-08-16 LAB — COMPREHENSIVE METABOLIC PANEL
ALT: 24 U/L (ref 0–53)
AST: 30 U/L (ref 0–37)
Albumin: 3.5 g/dL (ref 3.5–5.2)
Alkaline Phosphatase: 72 U/L (ref 39–117)
BUN: 42 mg/dL — AB (ref 6–23)
CO2: 22 meq/L (ref 19–32)
CREATININE: 2 mg/dL — AB (ref 0.50–1.35)
Calcium: 8.6 mg/dL (ref 8.4–10.5)
Chloride: 102 mEq/L (ref 96–112)
GFR calc Af Amer: 32 mL/min — ABNORMAL LOW (ref >90)
GFR, EST NON AFRICAN AMERICAN: 27 mL/min — AB (ref >90)
Glucose, Bld: 145 mg/dL — ABNORMAL HIGH (ref 70–99)
Potassium: 4.7 mEq/L (ref 3.7–5.3)
Sodium: 140 mEq/L (ref 137–147)
Total Bilirubin: 1.3 mg/dL — ABNORMAL HIGH (ref 0.3–1.2)
Total Protein: 6.3 g/dL (ref 6.0–8.3)

## 2013-08-16 MED ORDER — ACETAMINOPHEN 325 MG PO TABS
650.0000 mg | ORAL_TABLET | Freq: Once | ORAL | Status: AC
  Administered 2013-08-16: 650 mg via ORAL
  Filled 2013-08-16: qty 2

## 2013-08-16 MED ORDER — AZITHROMYCIN 250 MG PO TABS: ORAL_TABLET | ORAL | Status: DC

## 2013-08-16 NOTE — ED Provider Notes (Signed)
CSN: [id number]     Arrival date & time 08/16/13  [id number] History   First MD Initiated Contact with Patient 08/16/13 1005     Chief Complaint  Patient presents with  . Cough  . Nasal Congestion  . Sore Throat   (Consider location/radiation/quality/duration/timing/severity/associated sxs/prior Treatment) HPI Comments: Patient is a 80 year old male with history of hypertension. He is a resident of a retirement community. He presents with complaints of a three-day history of fever, cough, and chest congestion. He states he has been coughing up some phlegm. He denies chest pain or leg swelling. He did receive a flu immunization this year.  Patient is a 80 y.o. male presenting with cough. The history is provided by the patient.  Cough Cough characteristics:  Productive Sputum characteristics:  Clear and rusty Severity:  Moderate Onset quality:  Gradual Duration:  3 days Timing:  Constant Progression:  Worsening Chronicity:  New Relieved by:  Nothing Worsened by:  Nothing tried Ineffective treatments:  None tried   Past Medical History  Diagnosis Date  . Thyroid disease   . Cancer    Past Surgical History  Procedure Laterality Date  . Thyroid surgery     No family history on file. History  Substance Use Topics  . Smoking status: Never Smoker   . Smokeless tobacco: Not on file  . Alcohol Use: Yes    Review of Systems  Respiratory: Positive for cough.   All other systems reviewed and are negative.    Allergies  Review of patient's allergies indicates no known allergies.  Home Medications   Current Outpatient Rx  Name  Route  Sig  Dispense  Refill  . aspirin 81 MG tablet   Oral   Take 81 mg by mouth daily.         Marland Kitchen dutasteride (AVODART) 0.5 MG capsule   Oral   Take 0.5 mg by mouth daily.         . hydrochlorothiazide (MICROZIDE) 12.5 MG capsule   Oral   Take 12.5 mg by mouth daily.         Marland Kitchen LISINOPRIL PO   Oral   Take by mouth.         .  simvastatin (ZOCOR) 20 MG tablet   Oral   Take 20 mg by mouth daily.         . TRIAMTERENE PO   Oral   Take by mouth.          BP 108/39  Pulse 56  Temp(Src) 101.2 F (38.4 C) (Rectal)  Resp 22  Ht 5' 11.5" (1.816 m)  Wt 183 lb (83.008 kg)  BMI 25.17 kg/m2  SpO2 93% Physical Exam  Nursing note and vitals reviewed. Constitutional: He is oriented to person, place, and time. He appears well-developed and well-nourished. No distress.  HENT:  Head: Normocephalic and atraumatic.  Mouth/Throat: Oropharynx is clear and moist.  Neck: Normal range of motion. Neck supple.  Cardiovascular: Normal rate, regular rhythm and normal heart sounds.   No murmur heard. Pulmonary/Chest: Effort normal and breath sounds normal. No respiratory distress. He has no wheezes.  Abdominal: Soft. Bowel sounds are normal. He exhibits no distension. There is no tenderness.  Musculoskeletal: Normal range of motion. He exhibits no edema.  Lymphadenopathy:    He has no cervical adenopathy.  Neurological: He is alert and oriented to person, place, and time.  Skin: Skin is warm and dry. He is not diaphoretic.    ED Course  Procedures (including  critical care time) Labs Review Labs Reviewed  CBC WITH DIFFERENTIAL  COMPREHENSIVE METABOLIC PANEL   Imaging Review No results found.  EKG Interpretation    Date/Time:  Friday August 16 2013 09:58:18 EST Ventricular Rate:  67 PR Interval:  180 QRS Duration: 112 QT Interval:  392 QTC Calculation: 414 R Axis:   17 Text Interpretation:  Sinus rhythm with Premature atrial complexes Low voltage QRS Borderline ECG Confirmed by DELOS  MD, Eugene Isadore ([id number]) on 08/16/2013 11:44:31 AM            MDM  No diagnosis found. Patient is a 80 year old male from a retirement community. He presents with cough and chest congestion for the past 3 days. This will be influenza, however he is outside of the window for Tamiflu. His chest x-ray is unremarkable and does not  suggest pneumonia. I will treat with Zithromax for suspected bronchitis. Is not hypoxic and appeared clinically well. At this point I feel as though he is stable for discharge. He understands to return if he develops difficulty breathing, chest pain, or other new or concerning symptoms.    Veryl Speak, MD 08/16/13 1146

## 2013-08-16 NOTE — ED Notes (Signed)
Pt brought from [place] at [facility] via [facility]. Pt c/o cough, congestion, fever and sore throat.

## 2013-08-16 NOTE — ED Notes (Signed)
MD at bedside. 

## 2013-08-16 NOTE — ED Notes (Signed)
Pt chief complaint is sore throat x 3 days.

## 2013-08-16 NOTE — Discharge Instructions (Signed)
Tylenol 650 mg every 6 hours as needed for fever.  Zithromax as prescribed.  Return to the emergency department if you develop chest pain, difficulty breathing, high fever, or other new or concerning symptoms   Acute Bronchitis Bronchitis is inflammation of the airways that extend from the windpipe into the lungs (bronchi). The inflammation often causes mucus to develop. This leads to a cough, which is the most common symptom of bronchitis.  In acute bronchitis, the condition usually develops suddenly and goes away over time, usually in a couple weeks. Smoking, allergies, and asthma can make bronchitis worse. Repeated episodes of bronchitis may cause further lung problems.  CAUSES Acute bronchitis is most often caused by the same virus that causes a cold. The virus can spread from person to person (contagious).  SIGNS AND SYMPTOMS   Cough.   Fever.   Coughing up mucus.   Body aches.   Chest congestion.   Chills.   Shortness of breath.   Sore throat.  DIAGNOSIS  Acute bronchitis is usually diagnosed through a physical exam. Tests, such as chest X-rays, are sometimes done to rule out other conditions.  TREATMENT  Acute bronchitis usually goes away in a couple weeks. Often times, no medical treatment is necessary. Medicines are sometimes given for relief of fever or cough. Antibiotics are usually not needed but may be prescribed in certain situations. In some cases, an inhaler may be recommended to help reduce shortness of breath and control the cough. A cool mist vaporizer may also be used to help thin bronchial secretions and make it easier to clear the chest.  HOME CARE INSTRUCTIONS  Get plenty of rest.   Drink enough fluids to keep your urine clear or pale yellow (unless you have a medical condition that requires fluid restriction). Increasing fluids may help thin your secretions and will prevent dehydration.   Only take over-the-counter or prescription medicines as  directed by your health care provider.   Avoid smoking and secondhand smoke. Exposure to cigarette smoke or irritating chemicals will make bronchitis worse. If you are a smoker, consider using nicotine gum or skin patches to help control withdrawal symptoms. Quitting smoking will help your lungs heal faster.   Reduce the chances of another bout of acute bronchitis by washing your hands frequently, avoiding people with cold symptoms, and trying not to touch your hands to your mouth, nose, or eyes.   Follow up with your health care provider as directed.  SEEK MEDICAL CARE IF: Your symptoms do not improve after 1 week of treatment.  SEEK IMMEDIATE MEDICAL CARE IF:  You develop an increased fever or chills.   You have chest pain.   You have severe shortness of breath.  You have bloody sputum.   You develop dehydration.  You develop fainting.  You develop repeated vomiting.  You develop a severe headache. MAKE SURE YOU:   Understand these instructions.  Will watch your condition.  Will get help right away if you are not doing well or get worse. Document Released: 09/08/2004 Document Revised: 04/03/2013 Document Reviewed: 01/22/2013 [facility] Patient Information 2014 [place].

## 2014-02-13 ENCOUNTER — Other Ambulatory Visit: Payer: Self-pay | Admitting: Dermatology

## 2017-06-12 ENCOUNTER — Other Ambulatory Visit: Payer: Self-pay | Admitting: Dermatology

## 2018-05-07 ENCOUNTER — Other Ambulatory Visit: Payer: Self-pay | Admitting: Dermatology

## 2018-07-15 DEATH — deceased

## 2019-04-05 ENCOUNTER — Emergency Department (HOSPITAL_COMMUNITY): Payer: Medicare Other

## 2019-04-05 ENCOUNTER — Other Ambulatory Visit: Payer: Self-pay

## 2019-04-05 ENCOUNTER — Inpatient Hospital Stay (HOSPITAL_COMMUNITY)
Admission: EM | Admit: 2019-04-05 | Discharge: 2019-04-10 | DRG: 683 | Disposition: A | Discharge status: Skilled Nursing Facility | Payer: Medicare Other | Source: Skilled Nursing Facility | Attending: Internal Medicine | Admitting: Internal Medicine

## 2019-04-05 DIAGNOSIS — T464X5A Adverse effect of angiotensin-converting-enzyme inhibitors, initial encounter: Secondary | ICD-10-CM | POA: Diagnosis present

## 2019-04-05 DIAGNOSIS — L89152 Pressure ulcer of sacral region, stage 2: Secondary | ICD-10-CM | POA: Diagnosis present

## 2019-04-05 DIAGNOSIS — D62 Acute posthemorrhagic anemia: Secondary | ICD-10-CM | POA: Diagnosis present

## 2019-04-05 DIAGNOSIS — E860 Dehydration: Secondary | ICD-10-CM | POA: Diagnosis present

## 2019-04-05 DIAGNOSIS — E785 Hyperlipidemia, unspecified: Secondary | ICD-10-CM | POA: Diagnosis present

## 2019-04-05 DIAGNOSIS — N179 Acute kidney failure, unspecified: Secondary | ICD-10-CM | POA: Diagnosis not present

## 2019-04-05 DIAGNOSIS — Z8546 Personal history of malignant neoplasm of prostate: Secondary | ICD-10-CM

## 2019-04-05 DIAGNOSIS — Z7982 Long term (current) use of aspirin: Secondary | ICD-10-CM

## 2019-04-05 DIAGNOSIS — I4892 Unspecified atrial flutter: Secondary | ICD-10-CM | POA: Diagnosis present

## 2019-04-05 DIAGNOSIS — I129 Hypertensive chronic kidney disease with stage 1 through stage 4 chronic kidney disease, or unspecified chronic kidney disease: Secondary | ICD-10-CM | POA: Diagnosis present

## 2019-04-05 DIAGNOSIS — I4891 Unspecified atrial fibrillation: Secondary | ICD-10-CM | POA: Diagnosis present

## 2019-04-05 DIAGNOSIS — I10 Essential (primary) hypertension: Secondary | ICD-10-CM | POA: Diagnosis present

## 2019-04-05 DIAGNOSIS — N200 Calculus of kidney: Secondary | ICD-10-CM | POA: Diagnosis present

## 2019-04-05 DIAGNOSIS — I483 Typical atrial flutter: Secondary | ICD-10-CM | POA: Diagnosis present

## 2019-04-05 DIAGNOSIS — R310 Gross hematuria: Secondary | ICD-10-CM | POA: Diagnosis present

## 2019-04-05 DIAGNOSIS — L8990 Pressure ulcer of unspecified site, unspecified stage: Secondary | ICD-10-CM | POA: Insufficient documentation

## 2019-04-05 DIAGNOSIS — Z66 Do not resuscitate: Secondary | ICD-10-CM | POA: Diagnosis present

## 2019-04-05 DIAGNOSIS — Z20828 Contact with and (suspected) exposure to other viral communicable diseases: Secondary | ICD-10-CM | POA: Diagnosis present

## 2019-04-05 DIAGNOSIS — I952 Hypotension due to drugs: Secondary | ICD-10-CM | POA: Diagnosis present

## 2019-04-05 DIAGNOSIS — N184 Chronic kidney disease, stage 4 (severe): Secondary | ICD-10-CM | POA: Diagnosis present

## 2019-04-05 LAB — CBC WITH DIFFERENTIAL/PLATELET
Abs Immature Granulocytes: 0.03 10*3/uL (ref 0.00–0.07)
Basophils Absolute: 0.1 10*3/uL (ref 0.0–0.1)
Basophils Relative: 1 %
Eosinophils Absolute: 0.1 10*3/uL (ref 0.0–0.5)
Eosinophils Relative: 1 %
HCT: 44.4 % (ref 39.0–52.0)
Hemoglobin: 14.9 g/dL (ref 13.0–17.0)
Immature Granulocytes: 0 %
Lymphocytes Relative: 15 %
Lymphs Abs: 1.3 10*3/uL (ref 0.7–4.0)
MCH: 32.4 pg (ref 26.0–34.0)
MCHC: 33.6 g/dL (ref 30.0–36.0)
MCV: 96.5 fL (ref 80.0–100.0)
Monocytes Absolute: 0.9 10*3/uL (ref 0.1–1.0)
Monocytes Relative: 11 %
Neutro Abs: 6.1 10*3/uL (ref 1.7–7.7)
Neutrophils Relative %: 72 %
Platelets: 182 10*3/uL (ref 150–400)
RBC: 4.6 MIL/uL (ref 4.22–5.81)
RDW: 13.5 % (ref 11.5–15.5)
WBC: 8.5 10*3/uL (ref 4.0–10.5)
nRBC: 0 % (ref 0.0–0.2)

## 2019-04-05 LAB — BASIC METABOLIC PANEL
Anion gap: 11 (ref 5–15)
BUN: 68 mg/dL — ABNORMAL HIGH (ref 8–23)
CO2: 20 mmol/L — ABNORMAL LOW (ref 22–32)
Calcium: 8.5 mg/dL — ABNORMAL LOW (ref 8.9–10.3)
Chloride: 110 mmol/L (ref 98–111)
Creatinine, Ser: 3.34 mg/dL — ABNORMAL HIGH (ref 0.61–1.24)
GFR calc Af Amer: 17 mL/min — ABNORMAL LOW (ref >60)
GFR calc non Af Amer: 15 mL/min — ABNORMAL LOW (ref >60)
Glucose, Bld: 124 mg/dL — ABNORMAL HIGH (ref 70–99)
Potassium: 5.2 mmol/L — ABNORMAL HIGH (ref 3.5–5.1)
Sodium: 141 mmol/L (ref 135–145)

## 2019-04-05 LAB — TROPONIN I (HIGH SENSITIVITY)
Troponin I (High Sensitivity): 39 ng/L — ABNORMAL HIGH (ref <18)
Troponin I (High Sensitivity): 44 ng/L — ABNORMAL HIGH (ref <18)

## 2019-04-05 LAB — BRAIN NATRIURETIC PEPTIDE: B Natriuretic Peptide: 65.4 pg/mL (ref 0.0–100.0)

## 2019-04-05 LAB — MAGNESIUM: Magnesium: 2.2 mg/dL (ref 1.7–2.4)

## 2019-04-05 MED ORDER — SODIUM CHLORIDE 0.9 % IV SOLN
Freq: Once | INTRAVENOUS | Status: AC
  Administered 2019-04-05: 20:00:00 via INTRAVENOUS

## 2019-04-05 NOTE — ED Provider Notes (Addendum)
[facility] EMERGENCY DEPARTMENT Provider Note   CSN: [id number] Arrival date & time: 04/05/19  1753     History   Chief Complaint Chief Complaint  Patient presents with  . Dizziness    HPI Corey Small is a 85 y.o. male.  Presents the ER with 3 days of lightheadedness, increased weakness.  States has not had any episodes of passing out, no chest pain or difficulty breathing.  States he has had increased generalized fatigue, decreased energy, is unable to do his regular daily activities as easily.  Denies any history of atrial fibrillation, atrial flutter.     HPI  Past Medical History:  Diagnosis Date  . Cancer   . Thyroid disease     Patient Active Problem List   Diagnosis Date Noted  . DIVERTICULOSIS OF COLON 10/06/2009  . COLONIC POLYPS, ADENOMATOUS, HX OF 10/06/2009  . GASTROINTESTINAL HEMORRHAGE, HX OF 10/06/2009    Past Surgical History:  Procedure Laterality Date  . THYROID SURGERY          Home Medications    Prior to Admission medications   Medication Sig Start Date End Date Taking? Authorizing Provider  aspirin 81 MG tablet Take 81 mg by mouth daily.    [provider]  azithromycin (ZITHROMAX Z-PAK) 250 MG tablet 2 po day one, then 1 daily x 4 days 08/16/13   Veryl Speak, MD  dutasteride (AVODART) 0.5 MG capsule Take 0.5 mg by mouth daily.    [provider]  hydrochlorothiazide (MICROZIDE) 12.5 MG capsule Take 12.5 mg by mouth daily.    [provider]  LISINOPRIL PO Take by mouth.    [provider]  simvastatin (ZOCOR) 20 MG tablet Take 20 mg by mouth daily.    [provider]  TRIAMTERENE PO Take by mouth.    [provider]    Family History No family history on file.  Social History Social History   Tobacco Use  . Smoking status: Never Smoker  Substance Use Topics  . Alcohol use: Yes  . Drug use: No     Allergies   Patient has no known allergies.    Review of Systems Review of Systems  Constitutional: Positive for fatigue. Negative for chills and fever.  HENT: Negative for ear pain and sore throat.   Eyes: Negative for pain and visual disturbance.  Respiratory: Negative for cough and shortness of breath.   Cardiovascular: Positive for palpitations. Negative for chest pain.  Gastrointestinal: Negative for abdominal pain and vomiting.  Genitourinary: Negative for dysuria and hematuria.  Musculoskeletal: Negative for arthralgias and back pain.  Skin: Negative for color change and rash.  Neurological: Negative for seizures and syncope.  All other systems reviewed and are negative.    Physical Exam Updated Vital Signs Temp 98.9 F (37.2 C) (Oral)   Resp 11   Physical Exam Vitals signs and nursing note reviewed.  Constitutional:      Appearance: He is well-developed.  HENT:     Head: Normocephalic and atraumatic.  Eyes:     Conjunctiva/sclera: Conjunctivae normal.  Neck:     Musculoskeletal: Neck supple.  Cardiovascular:     Pulses: Normal pulses.     Heart sounds: No murmur.     Comments: Regularly irregular Pulmonary:     Effort: Pulmonary effort is normal. No respiratory distress.     Breath sounds: Normal breath sounds.  Abdominal:     Palpations: Abdomen is soft.  Tenderness: There is no abdominal tenderness.  Skin:    General: Skin is warm and dry.     Capillary Refill: Capillary refill takes less than 2 seconds.  Neurological:     General: No focal deficit present.     Mental Status: He is alert.      ED Treatments / Results  Labs (all labs ordered are listed, but only abnormal results are displayed) Labs Reviewed  CBC WITH DIFFERENTIAL/PLATELET  BASIC METABOLIC PANEL  MAGNESIUM  BRAIN NATRIURETIC PEPTIDE  TROPONIN I (HIGH SENSITIVITY)    EKG None April 04, 1806 atrial flutter with variable AV block, no significant ST segment changes, ventricular rate 78  Radiology No results found.   Procedures Procedures (including critical care time)  Medications Ordered in ED Medications - No data to display   Initial Impression / Assessment and Plan / ED Course  I have reviewed the triage vital signs and the nursing notes.  Pertinent labs & imaging results that were available during my care of the patient were reviewed by me and considered in my medical decision making (see chart for details).  Clinical Course as of Apr 05 2127  Fri Apr 05, 2019  1901 completed   [RD]    Clinical Course User Index [RD] Lucrezia Starch, MD       85 year old male presented to the ER with generalized weakness, found to be in new onset atrial flutter, variable conduction, mostly 3-1, ventricular rate remained relatively reasonable in 70s to 80s.  Blood pressure remained stable while in the department.  Consulted cardiology, given patient is at reasonable rate, no acute intervention needed right now.  They recommended getting echocardiogram as inpatient.  Labs concerning for acute kidney injury.  Started on fluids.  Will admit to hospitalist service for further management.  Dr. Hal Hope will admit to tele bed.  Final Clinical Impressions(s) / ED Diagnoses   Final diagnoses:  AKI (acute kidney injury) [facility])  Atrial flutter, unspecified type [facility])    ED Discharge Orders    None       Lucrezia Starch, MD 04/05/19 2131    Lucrezia Starch, MD 04/05/19 2133

## 2019-04-05 NOTE — ED Notes (Addendum)
Paged admitting MD due to pt's high HR in a flutter, reading as high as 180 on monitor but does not seem accurate due to peripheral pulse check being 100. Will continue to monitor

## 2019-04-05 NOTE — ED Notes (Signed)
Report given to 6E RN. All questions answered 

## 2019-04-05 NOTE — ED Triage Notes (Signed)
Pt presents from [facility] with EMS for dizziness and near-syncope x4 days that is worse with ambulation. EMS arrived and noted 3:1 atrial flutter on EKG with no past history of this.  BP 90/60, improved to 126/82 with 500 cc NS.  Takes lisinopril, simvastatin, triamterene, dutateride.

## 2019-04-06 ENCOUNTER — Encounter (HOSPITAL_COMMUNITY): Payer: Self-pay | Admitting: Internal Medicine

## 2019-04-06 ENCOUNTER — Observation Stay (HOSPITAL_COMMUNITY): Payer: Medicare Other

## 2019-04-06 DIAGNOSIS — I129 Hypertensive chronic kidney disease with stage 1 through stage 4 chronic kidney disease, or unspecified chronic kidney disease: Secondary | ICD-10-CM | POA: Diagnosis present

## 2019-04-06 DIAGNOSIS — Z8546 Personal history of malignant neoplasm of prostate: Secondary | ICD-10-CM | POA: Diagnosis not present

## 2019-04-06 DIAGNOSIS — N183 Chronic kidney disease, stage 3 (moderate): Secondary | ICD-10-CM | POA: Diagnosis not present

## 2019-04-06 DIAGNOSIS — R310 Gross hematuria: Secondary | ICD-10-CM | POA: Diagnosis present

## 2019-04-06 DIAGNOSIS — I480 Paroxysmal atrial fibrillation: Secondary | ICD-10-CM | POA: Diagnosis not present

## 2019-04-06 DIAGNOSIS — N184 Chronic kidney disease, stage 4 (severe): Secondary | ICD-10-CM | POA: Diagnosis present

## 2019-04-06 DIAGNOSIS — I10 Essential (primary) hypertension: Secondary | ICD-10-CM | POA: Diagnosis present

## 2019-04-06 DIAGNOSIS — D62 Acute posthemorrhagic anemia: Secondary | ICD-10-CM | POA: Diagnosis present

## 2019-04-06 DIAGNOSIS — R001 Bradycardia, unspecified: Secondary | ICD-10-CM | POA: Diagnosis not present

## 2019-04-06 DIAGNOSIS — E785 Hyperlipidemia, unspecified: Secondary | ICD-10-CM | POA: Diagnosis present

## 2019-04-06 DIAGNOSIS — N179 Acute kidney failure, unspecified: Secondary | ICD-10-CM | POA: Diagnosis present

## 2019-04-06 DIAGNOSIS — I952 Hypotension due to drugs: Secondary | ICD-10-CM | POA: Diagnosis present

## 2019-04-06 DIAGNOSIS — N200 Calculus of kidney: Secondary | ICD-10-CM | POA: Diagnosis present

## 2019-04-06 DIAGNOSIS — I4892 Unspecified atrial flutter: Secondary | ICD-10-CM | POA: Diagnosis not present

## 2019-04-06 DIAGNOSIS — Z20828 Contact with and (suspected) exposure to other viral communicable diseases: Secondary | ICD-10-CM | POA: Diagnosis present

## 2019-04-06 DIAGNOSIS — T464X5A Adverse effect of angiotensin-converting-enzyme inhibitors, initial encounter: Secondary | ICD-10-CM | POA: Diagnosis present

## 2019-04-06 DIAGNOSIS — I483 Typical atrial flutter: Secondary | ICD-10-CM | POA: Diagnosis present

## 2019-04-06 DIAGNOSIS — L89152 Pressure ulcer of sacral region, stage 2: Secondary | ICD-10-CM | POA: Diagnosis present

## 2019-04-06 DIAGNOSIS — E860 Dehydration: Secondary | ICD-10-CM | POA: Diagnosis present

## 2019-04-06 DIAGNOSIS — I4891 Unspecified atrial fibrillation: Secondary | ICD-10-CM | POA: Diagnosis present

## 2019-04-06 DIAGNOSIS — Z7982 Long term (current) use of aspirin: Secondary | ICD-10-CM | POA: Diagnosis not present

## 2019-04-06 DIAGNOSIS — Z66 Do not resuscitate: Secondary | ICD-10-CM | POA: Diagnosis present

## 2019-04-06 LAB — CBC WITH DIFFERENTIAL/PLATELET
Abs Immature Granulocytes: 0.04 10*3/uL (ref 0.00–0.07)
Basophils Absolute: 0.1 10*3/uL (ref 0.0–0.1)
Basophils Relative: 1 %
Eosinophils Absolute: 0.1 10*3/uL (ref 0.0–0.5)
Eosinophils Relative: 1 %
HCT: 42.6 % (ref 39.0–52.0)
Hemoglobin: 14.4 g/dL (ref 13.0–17.0)
Immature Granulocytes: 1 %
Lymphocytes Relative: 11 %
Lymphs Abs: 0.9 10*3/uL (ref 0.7–4.0)
MCH: 32.1 pg (ref 26.0–34.0)
MCHC: 33.8 g/dL (ref 30.0–36.0)
MCV: 95.1 fL (ref 80.0–100.0)
Monocytes Absolute: 0.7 10*3/uL (ref 0.1–1.0)
Monocytes Relative: 9 %
Neutro Abs: 6.7 10*3/uL (ref 1.7–7.7)
Neutrophils Relative %: 77 %
Platelets: 185 10*3/uL (ref 150–400)
RBC: 4.48 MIL/uL (ref 4.22–5.81)
RDW: 13.2 % (ref 11.5–15.5)
WBC: 8.5 10*3/uL (ref 4.0–10.5)
nRBC: 0 % (ref 0.0–0.2)

## 2019-04-06 LAB — COMPREHENSIVE METABOLIC PANEL
ALT: 16 U/L (ref 0–44)
AST: 19 U/L (ref 15–41)
Albumin: 3.3 g/dL — ABNORMAL LOW (ref 3.5–5.0)
Alkaline Phosphatase: 67 U/L (ref 38–126)
Anion gap: 10 (ref 5–15)
BUN: 64 mg/dL — ABNORMAL HIGH (ref 8–23)
CO2: 21 mmol/L — ABNORMAL LOW (ref 22–32)
Calcium: 8.4 mg/dL — ABNORMAL LOW (ref 8.9–10.3)
Chloride: 108 mmol/L (ref 98–111)
Creatinine, Ser: 3.05 mg/dL — ABNORMAL HIGH (ref 0.61–1.24)
GFR calc Af Amer: 19 mL/min — ABNORMAL LOW (ref >60)
GFR calc non Af Amer: 16 mL/min — ABNORMAL LOW (ref >60)
Glucose, Bld: 117 mg/dL — ABNORMAL HIGH (ref 70–99)
Potassium: 4 mmol/L (ref 3.5–5.1)
Sodium: 139 mmol/L (ref 135–145)
Total Bilirubin: 2 mg/dL — ABNORMAL HIGH (ref 0.3–1.2)
Total Protein: 5.5 g/dL — ABNORMAL LOW (ref 6.5–8.1)

## 2019-04-06 LAB — SARS CORONAVIRUS 2 (TAT 6-24 HRS): SARS Coronavirus 2: NEGATIVE

## 2019-04-06 LAB — HEPARIN LEVEL (UNFRACTIONATED)
Heparin Unfractionated: 0.62 IU/mL (ref 0.30–0.70)
Heparin Unfractionated: 0.89 IU/mL — ABNORMAL HIGH (ref 0.30–0.70)

## 2019-04-06 LAB — ECHOCARDIOGRAM COMPLETE
Height: 71 in
Weight: 2320 oz

## 2019-04-06 LAB — TSH: TSH: 1.278 u[IU]/mL (ref 0.350–4.500)

## 2019-04-06 LAB — GLUCOSE, CAPILLARY: Glucose-Capillary: 122 mg/dL — ABNORMAL HIGH (ref 70–99)

## 2019-04-06 MED ORDER — ONDANSETRON HCL 4 MG/2ML IJ SOLN: 4.0000 mg | Freq: Four times a day (QID) | INTRAMUSCULAR | Status: DC | PRN

## 2019-04-06 MED ORDER — ASPIRIN EC 81 MG PO TBEC
81.0000 mg | DELAYED_RELEASE_TABLET | Freq: Every day | ORAL | Status: DC
  Administered 2019-04-06 – 2019-04-08 (×3): 81 mg via ORAL
  Filled 2019-04-06 (×3): qty 1

## 2019-04-06 MED ORDER — HEPARIN (PORCINE) 25000 UT/250ML-% IV SOLN
850.0000 [IU]/h | INTRAVENOUS | Status: DC
  Administered 2019-04-06: 900 [IU]/h via INTRAVENOUS
  Administered 2019-04-06: 1100 [IU]/h via INTRAVENOUS
  Administered 2019-04-08: 850 [IU]/h via INTRAVENOUS
  Filled 2019-04-06 (×3): qty 250

## 2019-04-06 MED ORDER — ACETAMINOPHEN 650 MG RE SUPP: 650.0000 mg | Freq: Four times a day (QID) | RECTAL | Status: DC | PRN

## 2019-04-06 MED ORDER — ACETAMINOPHEN 325 MG PO TABS: 650.0000 mg | ORAL_TABLET | Freq: Four times a day (QID) | ORAL | Status: DC | PRN

## 2019-04-06 MED ORDER — PERFLUTREN LIPID MICROSPHERE
1.0000 mL | INTRAVENOUS | Status: AC | PRN
  Administered 2019-04-06: 3 mL via INTRAVENOUS
  Filled 2019-04-06: qty 10

## 2019-04-06 MED ORDER — SODIUM CHLORIDE 0.9 % IV SOLN
INTRAVENOUS | Status: AC
  Administered 2019-04-06 (×2): via INTRAVENOUS

## 2019-04-06 MED ORDER — ONDANSETRON HCL 4 MG PO TABS: 4.0000 mg | ORAL_TABLET | Freq: Four times a day (QID) | ORAL | Status: DC | PRN

## 2019-04-06 NOTE — Plan of Care (Signed)
  Problem: Clinical Measurements: Goal: Ability to maintain clinical measurements within normal limits will improve Outcome: Progressing Goal: Cardiovascular complication will be avoided Outcome: Progressing   

## 2019-04-06 NOTE — Progress Notes (Signed)
ANTICOAGULATION CONSULT NOTE - Initial Consult  Pharmacy Consult for heparin Indication: atrial fibrillation  No Known Allergies  Patient Measurements: Height: 5\' 11"  (180.3 cm) Weight: 170 lb (77.1 kg) IBW/kg (Calculated) : 75.3  Vital Signs: Temp: 98.7 F (37.1 C) (08/21 2318) Temp Source: Oral (08/21 2318) BP: 117/68 (08/21 2318) Pulse Rate: 77 (08/21 2318)  Labs: Recent Labs    04/05/19 1802 04/05/19 2228  HGB 14.9  --   HCT 44.4  --   PLT 182  --   CREATININE 3.34*  --   TROPONINIHS 39* 44*    Estimated Creatinine Clearance: 13.8 mL/min (A) (by C-G formula based on SCr of 3.34 mg/dL (H)).   Medical History: Past Medical History:  Diagnosis Date  . Cancer ([place])   . Thyroid disease     Medications:  Medications Prior to Admission  Medication Sig Dispense Refill Last Dose  . aspirin 81 MG tablet Take 81 mg by mouth daily.     Marland Kitchen azithromycin (ZITHROMAX Z-PAK) 250 MG tablet 2 po day one, then 1 daily x 4 days 6 tablet 0   . dutasteride (AVODART) 0.5 MG capsule Take 0.5 mg by mouth daily.     . hydrochlorothiazide (MICROZIDE) 12.5 MG capsule Take 12.5 mg by mouth daily.     Marland Kitchen LISINOPRIL PO Take by mouth.     . simvastatin (ZOCOR) 20 MG tablet Take 20 mg by mouth daily.     . TRIAMTERENE PO Take by mouth.      Scheduled:  . aspirin EC  81 mg Oral Daily   Infusions:  . sodium chloride      Assessment: 85yo male c/o dizziness and near-syncope x4d, found to be in Aflutter w/ AKI, to begin heparin gtt (no bolus per MD request).  Goal of Therapy:  Heparin level 0.3-0.7 units/ml Monitor platelets by anticoagulation protocol: Yes   Plan:  Will begin heparin gtt at 1100 units/hr and monitor heparin levels and CBC.  Wynona Neat, PharmD, BCPS  04/06/2019,12:14 AM

## 2019-04-06 NOTE — Progress Notes (Signed)
ANTICOAGULATION CONSULT NOTE - Follow Up Consult  Pharmacy Consult for heparin Indication: atrial fibrillation  No Known Allergies  Patient Measurements: Height: 5\' 11"  (180.3 cm) Weight: 145 lb (65.8 kg) IBW/kg (Calculated) : 75.3 Heparin Dosing Weight: 65.8  Vital Signs: Temp: 98.5 F (36.9 C) (08/22 0358) Temp Source: Oral (08/22 0358) BP: 132/75 (08/22 0358) Pulse Rate: 76 (08/22 0358)  Labs: Recent Labs    04/05/19 1802 04/05/19 2228 04/06/19 0617 04/06/19 0731  HGB 14.9  --  14.4  --   HCT 44.4  --  42.6  --   PLT 182  --  185  --   HEPARINUNFRC  --   --   --  0.62  CREATININE 3.34*  --  3.05*  --   TROPONINIHS 39* 44*  --   --     Estimated Creatinine Clearance: 13.2 mL/min (A) (by C-G formula based on SCr of 3.05 mg/dL (H)).   Medications:  Scheduled:  . aspirin EC  81 mg Oral Daily   Infusions:  . sodium chloride 75 mL/hr at 04/06/19 0250  . heparin 1,100 Units/hr (04/06/19 0254)    Assessment: 85yo male with PMH of HTN, HLD, and prostate cancer. Pt admitted on 8/21 for c/o dizziness and near-syncope x4d, found to be in [place] (new onset) w/ AKI. Pharmacy consulted to dose heparin. Pt was not on any anticoagulation PTA.   Heparin level at 0731 on 08/22 was therapeutic (0.62) on heparin infusion rate of 1,100 units/hr, however the level was drawn ~4.5 hours after the start of heparin infusion. Will reduce dose slightly to ensure heparin level does not become supratherapeutic. No signs/syptoms of bleeding or problems with infusion per RN.  Goal of Therapy:  Heparin level 0.3-0.7 units/ml Monitor platelets by anticoagulation protocol: Yes   Plan:  Reduce heparin infusion rate to 1050 units/hr. Check anti-Xa level in 8 hours and daily while on heparin Continue to monitor H&H and platelets  Monitor for signs/symptoms of bleeding.  Sherren Kerns, PharmD PGY1 Acute Care Pharmacy Resident [phone] 04/06/2019,9:50 AM

## 2019-04-06 NOTE — H&P (Signed)
History and Physical    Corey Small OQH:[id number] DOB: [birth year] DOA: 04/05/2019  PCP: Lorene Dy, MD  Patient coming from: [place].  Chief Complaint: Feeling weak and dizzy.  HPI: Corey Small is a 85 y.o. male with history of hypertension hyperlipidemia prostate cancer was brought to the ER after patient has been feeling weak and dizzy over the last 4 days.  Patient symptoms are mostly on standing.  Patient states over the last 3 to 4 days has not eaten well because of the symptoms.  Denies any chest pain or shortness of breath.  Has had one fall where patient did not hit his head or lose consciousness.  This happened around 3 days ago.  Given the symptoms EMS was called.  Patient was found to be hypotensive was given 5 cc normal saline bolus.  ED Course: In the ER patient blood pressure improved with the fluid bolus.  EKG shows atrial flutter with rate controlled.  Which is new for the patient.  Cardiology was notified and cardiology recommended getting 2D echo and no other recommendations and his heart rate was controlled.  Patient's labs revealed creatinine of 3.3.  Last one we have in our system was in 2015 which was 2.  BNP was 65.4 high-sensitivity troponin was 39 and 44.  Given the acute renal failure hypotensive picture patient was hydrated.  Review of Systems: As per HPI, rest all negative.   Past Medical History:  Diagnosis Date  . Cancer ([place])   . Thyroid disease     Past Surgical History:  Procedure Laterality Date  . THYROID SURGERY       reports that he has never smoked. He has never used smokeless tobacco. He reports current alcohol use. He reports that he does not use drugs.  No Known Allergies  Family History  Family history unknown: Yes    Prior to Admission medications   Medication Sig Start Date End Date Taking? Authorizing Provider  aspirin 81 MG tablet Take 81 mg by mouth daily.    [provider]  azithromycin  (ZITHROMAX Z-PAK) 250 MG tablet 2 po day one, then 1 daily x 4 days 08/16/13   Veryl Speak, MD  dutasteride (AVODART) 0.5 MG capsule Take 0.5 mg by mouth daily.    [provider]  hydrochlorothiazide (MICROZIDE) 12.5 MG capsule Take 12.5 mg by mouth daily.    [provider]  LISINOPRIL PO Take by mouth.    [provider]  simvastatin (ZOCOR) 20 MG tablet Take 20 mg by mouth daily.    [provider]  TRIAMTERENE PO Take by mouth.    [provider]    Physical Exam: Constitutional: Moderately built and nourished. Vitals:   04/05/19 2051 04/05/19 2215 04/05/19 2224 04/05/19 2318  BP:   109/68 117/68  Pulse: 80 80 80 77  Resp: 20 16 15 18   Temp:    98.7 F (37.1 C)  TempSrc:    Oral  SpO2: 96% 98% 98% 91%  Weight:      Height:       Eyes: Anicteric no pallor. ENMT: No discharge from the ears eyes nose or mouth. Neck: No mass felt.  No neck rigidity.  No JVD appreciated. Respiratory: No rhonchi or crepitations. Cardiovascular: S1-S2 heard. Abdomen: Soft nontender bowel sounds present. Musculoskeletal: No edema. Skin: Chronic skin changes in the lower extremity. Neurologic: Alert awake oriented to time place and person.  Moves all extremities. Psychiatric: Appears normal  per normal affect.   Labs on Admission: I have personally reviewed following labs and imaging studies  CBC: Recent Labs  Lab 04/05/19 1802  WBC 8.5  NEUTROABS 6.1  HGB 14.9  HCT 44.4  MCV 96.5  PLT 767   Basic Metabolic Panel: Recent Labs  Lab 04/05/19 1802  NA 141  K 5.2*  CL 110  CO2 20*  GLUCOSE 124*  BUN 68*  CREATININE 3.34*  CALCIUM 8.5*  MG 2.2   GFR: Estimated Creatinine Clearance: 13.8 mL/min (A) (by C-G formula based on SCr of 3.34 mg/dL (H)). Liver Function Tests: No results for input(s): AST, ALT, ALKPHOS, BILITOT, PROT, ALBUMIN in the last 168 hours. No results for input(s): LIPASE, AMYLASE in the last 168 hours. No results for  input(s): AMMONIA in the last 168 hours. Coagulation Profile: No results for input(s): INR, PROTIME in the last 168 hours. Cardiac Enzymes: No results for input(s): CKTOTAL, CKMB, CKMBINDEX, TROPONINI in the last 168 hours. BNP (last 3 results) No results for input(s): PROBNP in the last 8760 hours. HbA1C: No results for input(s): HGBA1C in the last 72 hours. CBG: No results for input(s): GLUCAP in the last 168 hours. Lipid Profile: No results for input(s): CHOL, HDL, LDLCALC, TRIG, CHOLHDL, LDLDIRECT in the last 72 hours. Thyroid Function Tests: No results for input(s): TSH, T4TOTAL, FREET4, T3FREE, THYROIDAB in the last 72 hours. Anemia Panel: No results for input(s): VITAMINB12, FOLATE, FERRITIN, TIBC, IRON, RETICCTPCT in the last 72 hours. Urine analysis: No results found for: COLORURINE, APPEARANCEUR, LABSPEC, PHURINE, GLUCOSEU, HGBUR, BILIRUBINUR, KETONESUR, PROTEINUR, UROBILINOGEN, NITRITE, LEUKOCYTESUR Sepsis Labs: @LABRCNTIP (procalcitonin:4,lacticidven:4) )No results found for this or any previous visit (from the past 240 hour(s)).   Radiological Exams on Admission: Dg Chest Portable 1 View  Result Date: 04/05/2019 CLINICAL DATA:  Reason for exam: sob Patient denies any sob or chest pains. Slight cough. Reports weakness for 3 days, and loss of appetite. No prior heart or lung conditions, hx of cancer unspecified type. No prior heart or lung surgeries. Non.*comment was truncated* EXAM: PORTABLE CHEST 1 VIEW COMPARISON:  08/16/2013 FINDINGS: Heart size is normal. Aorta is tortuous. There are no focal consolidations or pleural effusions. No pulmonary edema. IMPRESSION: No active disease. Electronically Signed   By: Nolon Nations M.D.   On: 04/05/2019 18:54    EKG: Independently reviewed.  Atrial flutter 3-1 block.  Assessment/Plan Principal Problem:   Atrial flutter (HCC) Active Problems:   AKI (acute kidney injury) ([place])   Essential hypertension   ARF (acute renal  failure) ([place])    1. Acute renal failure with generalized weakness likely from dehydration poor oral intake -we will continue with hydration check 2D echo.  Check UA.  Hold antihypertensives patient blood pressure was low normal.  I think patient's generalized weakness is from dehydration and hypotension. 2. Atrial flutter appears to be new.  Discussed with cardiologist at this time recommended 2D echo and also starting heparin since patient has both risk of stroke and also risk for bleeding.  Patient has had previous history of GI bleed in 2003.  At this time given the risk of stroke chads 2 vasc score of 3 patient is started on a heparin.  Will closely monitor. 3. Hypertension holding of antihypertensives due to low normal blood pressure. 4. History of prostate cancer. 5. Hyperlipidemia on statins with   DVT prophylaxis: Heparin infusion. Code Status: DNR confirmed with patient. Family Communication: Discussed with patient. Disposition Plan: Skilled nursing facility. Consults called: Discussed with cardiology.  Admission status: Observation.   Rise Patience MD [facility] Pager [phone].  If 7PM-7AM, please contact night-coverage [email or web address] Password [email or web address]  04/06/2019, 12:12 AM

## 2019-04-06 NOTE — Progress Notes (Signed)
  Echocardiogram 2D Echocardiogram has been performed.  Corey Small 04/06/2019, 3:05 PM

## 2019-04-06 NOTE — Progress Notes (Signed)
Patient seen and examined personally, I reviewed the chart, history and physical and admission note, done by admitting physician this morning and agree with the same with following addendum.  Please refer to the morning admission note for more detailed plan of care.  Briefly,  85 y.o. male with history of hypertension hyperlipidemia prostate cancer from SNF was brought to the ER after patient has been feeling weak and dizzy over the last 4 days.  Symptoms mostly present understanding, has not eaten well because of the symptoms.  He also had a fall 3 days PTA while patient did not reduce head or lose consciousness. EMS was called.  Patient was found to be hypotensive was given normal saline bolus. In the ER blood pressure improved, EKG showed atrial flutter, lab work showed significant AKI, patient was admitted for further management after consulting with cardiology.  On my exam patient appears ill frail.  Not in acute distress.  Rate controlled. Remains on IV fluid hydration for his AKI.   AKI on CKD IV .  BUN/creatinine 08/15/2013 was 42/2. w/  CKD stage IV.  Unclear AKI versus progressive CKD.  Could be AKI in the setting of hypotension poor oral intake, will keep on IV fluid hydration and see if creatinine improves.  Creatinine is trending down.  Hold his HCTZ/lisinopril/triamterene. Recent Labs  Lab 04/05/19 1802 04/06/19 0617  BUN 68* 64*  CREATININE 3.34* 3.05*   A flutter,new oneet rate controlled on heparin drip for anticoagulation given his high chads 2 vas score.  If creatinine remains elevated he will need to be on Coumadin otherwise Eliquis if creatinine improves.  TSH is stable. Pending chocardiogram, appreciate cardiology input.  Hypertension blood pressure was soft, continue to hold meds.  History of prostate cancer next hyperlipidemia on statin.  Deconditioning/debility consulted PT OT Patient continues to hospitalization, will likely need at least need additional night to  stay to see how his renal function responds with hydration, given that he has a poor oral intake and has been symptomatic for 3-4 days at home and will need to adjust his anticoagulation.

## 2019-04-06 NOTE — Progress Notes (Signed)
ANTICOAGULATION CONSULT NOTE - Follow Up Consult  Pharmacy Consult for heparin Indication: atrial fibrillation  No Known Allergies  Patient Measurements: Height: 5\' 11"  (180.3 cm) Weight: 145 lb (65.8 kg) IBW/kg (Calculated) : 75.3 Heparin Dosing Weight: 65.8  Vital Signs: Temp: 97.8 F (36.6 C) (08/22 1458) Temp Source: Oral (08/22 1458) BP: 128/70 (08/22 1458) Pulse Rate: 78 (08/22 1458)  Labs: Recent Labs    04/05/19 1802 04/05/19 2228 04/06/19 0617 04/06/19 0731 04/06/19 1751  HGB 14.9  --  14.4  --   --   HCT 44.4  --  42.6  --   --   PLT 182  --  185  --   --   HEPARINUNFRC  --   --   --  0.62 0.89*  CREATININE 3.34*  --  3.05*  --   --   TROPONINIHS 39* 44*  --   --   --     Estimated Creatinine Clearance: 13.2 mL/min (A) (by C-G formula based on SCr of 3.05 mg/dL (H)).   Medications:  Scheduled:  . aspirin EC  81 mg Oral Daily   Infusions:  . sodium chloride 75 mL/hr at 04/06/19 1508  . heparin 1,050 Units/hr (04/06/19 1230)    Assessment: 85yo male with PMH of HTN, HLD, and prostate cancer. Pt admitted on 8/21 for c/o dizziness and near-syncope x4d, found to be in [place] (new onset) w/ AKI. Pharmacy consulted to dose heparin. Pt was not on any anticoagulation PTA.   Heparin level is now slightly supratherapeutic. No bleeding noted.  Goal of Therapy:  Heparin level 0.3-0.7 units/ml Monitor platelets by anticoagulation protocol: Yes   Plan:  Reduce heparin infusion rate to 900 units/hr. Check anti-Xa level in 8 hours and daily while on heparin Continue to monitor H&H and platelets  Monitor for signs/symptoms of bleeding.  Salome Arnt, PharmD, BCPS Please see AMION for all pharmacy numbers 04/06/2019 6:55 PM

## 2019-04-06 NOTE — Consult Note (Signed)
Cardiology Consultation:   Patient ID: Corey Small MRN: [id number]; DOB: 01-20-1920  Admit date: 04/05/2019 Date of Consult: 04/06/2019  Primary Care Provider: Lorene Dy, MD Primary Cardiologist: No primary care provider on file.  Primary Electrophysiologist:  None    Patient Profile:   Corey Small is a 85 y.o. male with a hx of hypertension, hyperlipidemia, prostate cancer who is being seen today for the evaluation of atrial flutter at the request of our Corey Small.  History of Present Illness:   Corey Small is a 85 year old male with a history of hypertension, hyperlipidemia, and prostate cancer was brought to the ER after feeling weak and dizzy for the last 4 days.  His symptoms are mostly on standing.  He has not eaten well over this time.  He denies chest pain or shortness of breath.  He did have one fall, but he does not hit his head or lose consciousness.  In the emergency room, his blood pressure was found to be low which improved with a fluid bolus.  ECG showed atrial flutter with a controlled ventricular rate.  This is apparently new for the patient.  His high-sensitivity troponin was low and did not trend up.  His BNP was 65.4.  The patient was found to be in acute renal failure and has since been hydrated.  Heart Pathway Score:     Past Medical History:  Diagnosis Date  . Cancer ([place])   . Thyroid disease     Past Surgical History:  Procedure Laterality Date  . THYROID SURGERY       Home Medications:  Prior to Admission medications   Medication Sig Start Date End Date Taking? Authorizing Provider  aspirin 81 MG tablet Take 81 mg by mouth daily.    [provider]  azithromycin (ZITHROMAX Z-PAK) 250 MG tablet 2 po day one, then 1 daily x 4 days 08/16/13   Veryl Speak, MD  dutasteride (AVODART) 0.5 MG capsule Take 0.5 mg by mouth daily.    [provider]  hydrochlorothiazide (MICROZIDE) 12.5 MG capsule Take 12.5 mg by mouth daily.     [provider]  LISINOPRIL PO Take by mouth.    [provider]  simvastatin (ZOCOR) 20 MG tablet Take 20 mg by mouth daily.    [provider]  TRIAMTERENE PO Take by mouth.    [provider]    Inpatient Medications: Scheduled Meds: . aspirin EC  81 mg Oral Daily   Continuous Infusions: . sodium chloride 75 mL/hr at 04/06/19 0250  . heparin 1,100 Units/hr (04/06/19 0254)   PRN Meds: acetaminophen **OR** acetaminophen, ondansetron **OR** ondansetron (ZOFRAN) IV  Allergies:   No Known Allergies  Social History:   Social History   Socioeconomic History  . Marital status: Married    Spouse name: Not on file  . Number of children: Not on file  . Years of education: Not on file  . Highest education level: Not on file  Occupational History  . Not on file  Social Needs  . Financial resource strain: Not on file  . Food insecurity    Worry: Not on file    Inability: Not on file  . Transportation needs    Medical: Not on file    Non-medical: Not on file  Tobacco Use  . Smoking status: Never Smoker  . Smokeless tobacco: Never Used  Substance and Sexual Activity  . Alcohol use: Yes  . Drug use: No  . Sexual activity:  Not on file  Lifestyle  . Physical activity    Days per week: Not on file    Minutes per session: Not on file  . Stress: Not on file  Relationships  . Social Herbalist on phone: Not on file    Gets together: Not on file    Attends religious service: Not on file    Active member of club or organization: Not on file    Attends meetings of clubs or organizations: Not on file    Relationship status: Not on file  . Intimate partner violence    Fear of current or ex partner: Not on file    Emotionally abused: Not on file    Physically abused: Not on file    Forced sexual activity: Not on file  Other Topics Concern  . Not on file  Social History Narrative  . Not on file    Family History:    Family  History  Family history unknown: Yes     ROS:  Please see the history of present illness.   All other ROS reviewed and negative.     Physical Exam/Data:   Vitals:   04/05/19 2215 04/05/19 2224 04/05/19 2318 04/06/19 0358  BP:  109/68 117/68 132/75  Pulse: 80 80 77 76  Resp: 16 15 18 18   Temp:   98.7 F (37.1 C) 98.5 F (36.9 C)  TempSrc:   Oral Oral  SpO2: 98% 98% 91% 91%  Weight:    65.8 kg  Height:    5\' 11"  (1.803 m)    Intake/Output Summary (Last 24 hours) at 04/06/2019 0944 Last data filed at 04/05/2019 1754 Gross per 24 hour  Intake 500 ml  Output -  Net 500 ml   Last 3 Weights 04/06/2019 04/05/2019 08/16/2013  Weight (lbs) 145 lb 170 lb 183 lb  Weight (kg) 65.772 kg 77.111 kg 83.008 kg     Body mass index is 20.22 kg/m.  General:  Well nourished, well developed, in no acute distress HEENT: normal Lymph: no adenopathy Neck: no JVD Endocrine:  No thryomegaly Vascular: No carotid bruits; FA pulses 2+ bilaterally without bruits  Cardiac:  normal S1, S2; RRR; no murmur  Lungs:  clear to auscultation bilaterally, no wheezing, rhonchi or rales  Abd: soft, nontender, no hepatomegaly  Ext: no edema Musculoskeletal:  No deformities, BUE and BLE strength normal and equal Skin: warm and dry  Neuro:  CNs 2-12 intact, no focal abnormalities noted Psych:  Normal affect   EKG:  The EKG was personally reviewed and demonstrates:  Atrial flutter, rate 78 Telemetry:  Telemetry was personally reviewed and demonstrates:  Atrial flutter  Relevant CV Studies: TTE pending  Laboratory Data:  High Sensitivity Troponin:   Recent Labs  Lab 04/05/19 1802 04/05/19 2228  TROPONINIHS 39* 44*     Cardiac EnzymesNo results for input(s): TROPONINI in the last 168 hours. No results for input(s): TROPIPOC in the last 168 hours.  Chemistry Recent Labs  Lab 04/05/19 1802 04/06/19 0617  NA 141 139  K 5.2* 4.0  CL 110 108  CO2 20* 21*  GLUCOSE 124* 117*  BUN 68* 64*  CREATININE  3.34* 3.05*  CALCIUM 8.5* 8.4*  GFRNONAA 15* 16*  GFRAA 17* 19*  ANIONGAP 11 10    Recent Labs  Lab 04/06/19 0617  PROT 5.5*  ALBUMIN 3.3*  AST 19  ALT 16  ALKPHOS 67  BILITOT 2.0*   Hematology Recent Labs  Lab  04/05/19 1802 04/06/19 0617  WBC 8.5 8.5  RBC 4.60 4.48  HGB 14.9 14.4  HCT 44.4 42.6  MCV 96.5 95.1  MCH 32.4 32.1  MCHC 33.6 33.8  RDW 13.5 13.2  PLT 182 185   BNP Recent Labs  Lab 04/05/19 1803  BNP 65.4    DDimer No results for input(s): DDIMER in the last 168 hours.   Radiology/Studies:  Dg Chest Portable 1 View  Result Date: 04/05/2019 CLINICAL DATA:  Reason for exam: sob Patient denies any sob or chest pains. Slight cough. Reports weakness for 3 days, and loss of appetite. No prior heart or lung conditions, hx of cancer unspecified type. No prior heart or lung surgeries. Non.*comment was truncated* EXAM: PORTABLE CHEST 1 VIEW COMPARISON:  08/16/2013 FINDINGS: Heart size is normal. Aorta is tortuous. There are no focal consolidations or pleural effusions. No pulmonary edema. IMPRESSION: No active disease. Electronically Signed   By: Nolon Nations M.D.   On: 04/05/2019 18:54    Assessment and Plan:   1. Typical appearing atrial flutter: Found incidentally.  The patient was brought to the hospital after feeling weak and dizzy and appears to be dehydrated after poor p.o. intake.  The patient is minimally symptomatic from his atrial flutter.  He is currently on heparin.  His creatinine is quite elevated.  He would thus need to be loaded on Coumadin.  Would continue his heparin at the current dose, and switch him to an oral anticoagulant.  He could be put on Eliquis if his kidney function improves, otherwise Coumadin would be a reasonable alternative..  As it appears that he is asymptomatic from his atrial flutter, would not pursue a rhythm control strategy, only a rate control strategy at this time.  Agree with plan of TTE.   This patients CHA2DS2-VASc  Score and unadjusted Ischemic Stroke Rate (% per year) is equal to 3.2 % stroke rate/year from a score of 3  Above score calculated as 1 point each if present [CHF, HTN, DM, Vascular=MI/PAD/Aortic Plaque, Age if 65-74, or Male] Above score calculated as 2 points each if present [Age > 75, or Stroke/TIA/TE]    2. Acute renal failure: Has improved with IV fluids, but continues to have acute renal failure.  Plan per primary team. 3. Weakness/fatigue: Likely due to poor p.o. intake and dehydration.  Hydrate per primary team.      For questions or updates, please contact [place] Please consult [email or web address] for contact info under     Signed, Marcianne Ozbun Corey Leeds, MD  04/06/2019 9:44 AM

## 2019-04-07 ENCOUNTER — Encounter (HOSPITAL_COMMUNITY): Payer: Self-pay | Admitting: Nephrology

## 2019-04-07 ENCOUNTER — Inpatient Hospital Stay (HOSPITAL_COMMUNITY): Payer: Medicare Other

## 2019-04-07 DIAGNOSIS — L8990 Pressure ulcer of unspecified site, unspecified stage: Secondary | ICD-10-CM | POA: Insufficient documentation

## 2019-04-07 LAB — CBC
HCT: 40.8 % (ref 39.0–52.0)
Hemoglobin: 13.7 g/dL (ref 13.0–17.0)
MCH: 32 pg (ref 26.0–34.0)
MCHC: 33.6 g/dL (ref 30.0–36.0)
MCV: 95.3 fL (ref 80.0–100.0)
Platelets: 178 10*3/uL (ref 150–400)
RBC: 4.28 MIL/uL (ref 4.22–5.81)
RDW: 13.4 % (ref 11.5–15.5)
WBC: 12.5 10*3/uL — ABNORMAL HIGH (ref 4.0–10.5)
nRBC: 0 % (ref 0.0–0.2)

## 2019-04-07 LAB — URINALYSIS, ROUTINE W REFLEX MICROSCOPIC: RBC / HPF: >50 RBC/hpf — ABNORMAL HIGH (ref 0–5)

## 2019-04-07 LAB — BASIC METABOLIC PANEL
Anion gap: 10 (ref 5–15)
BUN: 68 mg/dL — ABNORMAL HIGH (ref 8–23)
CO2: 19 mmol/L — ABNORMAL LOW (ref 22–32)
Calcium: 8.3 mg/dL — ABNORMAL LOW (ref 8.9–10.3)
Chloride: 111 mmol/L (ref 98–111)
Creatinine, Ser: 4.69 mg/dL — ABNORMAL HIGH (ref 0.61–1.24)
GFR calc Af Amer: 11 mL/min — ABNORMAL LOW (ref >60)
GFR calc non Af Amer: 10 mL/min — ABNORMAL LOW (ref >60)
Glucose, Bld: 125 mg/dL — ABNORMAL HIGH (ref 70–99)
Potassium: 4.5 mmol/L (ref 3.5–5.1)
Sodium: 140 mmol/L (ref 135–145)

## 2019-04-07 LAB — ABO/RH: ABO/RH(D): O POS

## 2019-04-07 LAB — SODIUM, URINE, RANDOM: Sodium, Ur: 92 mmol/L

## 2019-04-07 LAB — HEPARIN LEVEL (UNFRACTIONATED)
Heparin Unfractionated: 0.56 IU/mL (ref 0.30–0.70)
Heparin Unfractionated: 0.69 IU/mL (ref 0.30–0.70)

## 2019-04-07 LAB — TYPE AND SCREEN
ABO/RH(D): O POS
Antibody Screen: NEGATIVE

## 2019-04-07 LAB — CREATININE, URINE, RANDOM: Creatinine, Urine: 66.73 mg/dL

## 2019-04-07 LAB — HEMOGLOBIN AND HEMATOCRIT, BLOOD
HCT: 37.6 % — ABNORMAL LOW (ref 39.0–52.0)
Hemoglobin: 12.8 g/dL — ABNORMAL LOW (ref 13.0–17.0)

## 2019-04-07 MED ORDER — DUTASTERIDE 0.5 MG PO CAPS
0.5000 mg | ORAL_CAPSULE | Freq: Every morning | ORAL | Status: DC
  Administered 2019-04-07 – 2019-04-10 (×4): 0.5 mg via ORAL
  Filled 2019-04-07 (×4): qty 1

## 2019-04-07 MED ORDER — SODIUM CHLORIDE 0.9 % IV BOLUS: 250.0000 mL | Freq: Once | INTRAVENOUS | Status: DC

## 2019-04-07 MED ORDER — SODIUM CHLORIDE 0.9 % IV SOLN
INTRAVENOUS | Status: DC
  Administered 2019-04-08 (×2): via INTRAVENOUS

## 2019-04-07 MED ORDER — SODIUM CHLORIDE 0.9 % IV BOLUS
1500.0000 mL | Freq: Once | INTRAVENOUS | Status: AC
  Administered 2019-04-07: 1500 mL via INTRAVENOUS

## 2019-04-07 MED ORDER — SODIUM CHLORIDE 0.9 % IV BOLUS
1000.0000 mL | Freq: Once | INTRAVENOUS | Status: AC
  Administered 2019-04-07: 1000 mL via INTRAVENOUS

## 2019-04-07 NOTE — Progress Notes (Addendum)
PROGRESS NOTE    Corey Small  JEH:[id number] DOB: 1920-01-04 DOA: 04/05/2019 PCP: Lorene Dy, MD   Brief Narrative:  85 y.o. male with history of hypertension hyperlipidemia prostate cancer from SNF was brought to the ER after patient has been feeling weak and dizzy over the last 4 days.  Symptoms mostly present understanding, has not eaten well because of the symptoms.  He also had a fall 3 days PTA while patient did not reduce head or lose consciousness. EMS was called.  Patient was found to be hypotensive was given normal saline bolus. In the ER blood pressure improved, EKG showed atrial flutter, lab work showed significant AKI, patient was admitted for further management after consulting with cardiology.  Subjective:  Resting well. No new complaints.  Converted to normal sinus tach this afternoon. Blood pressure 94/47 earlier this morning and received 500 mL bolus.  Assessment & Plan:   Principal Problem:   Atrial flutter ([place]) Active Problems:   AKI (acute kidney injury) ([place])   Essential hypertension   ARF (acute renal failure) ([facility])   Pressure injury of skin  AKI on CKD IV .  BUN/creatinine 08/15/2013 was 42/2. w/  CKD stage IV.  Unclear AKI versus progressive CKD.Could be AKI in the setting of hypotension poor oral intake. Creat was donw trensing slowly but  Worsening this am. got 1 l bolus overnight as BP was soft 94/57 and tachycardic in 140. this am bladder scan w retention and needed I/o cath with 800 ml urine- obtain renal US, consulted nephro- cont  On iv nss.Cont to hold his HCTZ/lisinopril/triamterene. Recent Labs  Lab 04/05/19 1802 04/06/19 0617 04/07/19 0318  BUN 68* 64* 68*  CREATININE 3.34* 3.05* 4.69*   A flutter,new onset rate controlled on heparin drip for anticoagulation given his high chads 2 vas score.  If creatinine remains elevated he will need to be on Coumadin otherwise Eliquis if creatinine improves.  TSH is stable. TTE stable w Normal  LVEF.  Apprecaite cardiology input-will need O/P Anticoagualtion.  I discussed with patient since regarding anticoagulation benefits and he is in agreement.  Will await for creatinine to stabilize to decide DOAC vs Warfarin -Patient's son prefers DOAC.  Hypertension blood pressure was 94 overnight- no significant hypotension episode was hypotensive for EMS.  In afternoon BP was 90/48 ordered 250 ml bolus, already on 125 ml/hr nss.  History of prostate cancer next hyperlipidemia on statin. Resumed avodart. Urine retention needing I/o earlier today.Cont bladder scan q 4 h  Deconditioning/debility-cont PT OT  Addendum at 1750:  Hematuria: patient started having bloody urine and clots from Penis- on heparin- consulted and discussed w urology- planning for foley and monitor if persistent then hold heparin. See urology note. I ordered type/screen, and serial h/h.  Pressure Ulcer: Pressure Injury 04/06/19 Sacrum Stage II -  Partial thickness loss of dermis presenting as a shallow open ulcer with a red, pink wound bed without slough. (Active)  04/06/19 0800  Location: Sacrum  Location Orientation:   Staging: Stage II -  Partial thickness loss of dermis presenting as a shallow open ulcer with a red, pink wound bed without slough. (skin tear/abrasion)  Wound Description (Comments):   Present on Admission: Yes   Body mass index is 20.22 kg/m.   DVT prophylaxis:Heparin Code Status: FULL Family Communication: plan of care discussed with patient in detail. I tried to call his wife no but no answer. I was able to speak to son- patient's wife has passed away, updated  son. Will update daughter later today-called her no but no answer.  Disposition Plan: Remains inpatient pending clinical improvement.   Consultants:  Cardio nephro  Procedures: TTE:  1. The left ventricle has normal systolic function with an ejection fraction of 60-65%. The cavity size was normal. Left ventricular diastolic function could  not be evaluated secondary to atrial fibrillation.  2. The right ventricle has normal systolic function. The cavity was normal. There is no increase in right ventricular wall thickness.  3. The aortic valve is abnormal. Mild thickening of the aortic valve. Mild calcification of the aortic valve. Aortic valve regurgitation is trivial by color flow Doppler.  4. The aorta is normal unless otherwise noted.  5. The aortic root and ascending aorta are normal in size and structure.  6. The atrial septum is grossly normal.  Microbiology: None  Antimicrobials: Anti-infectives (From admission, onward)   None       Objective: Vitals:   04/06/19 0358 04/06/19 1458 04/06/19 2033 04/07/19 0640  BP: 132/75 128/70 (!) 97/50 (!) 94/57  Pulse: 76 78 78 72  Resp: 18 20 16 18   Temp: 98.5 F (36.9 C) 97.8 F (36.6 C) 97.9 F (36.6 C) 98.2 F (36.8 C)  TempSrc: Oral Oral Oral Oral  SpO2: 91% 97% 96% 97%  Weight: 65.8 kg     Height: 5\' 11"  (1.803 m)       Intake/Output Summary (Last 24 hours) at 04/07/2019 1149 Last data filed at 04/07/2019 0800 Gross per 24 hour  Intake 1992.96 ml  Output 850 ml  Net 1142.96 ml   Filed Weights   04/05/19 1802 04/06/19 0358  Weight: 77.1 kg 65.8 kg   Weight change:   Body mass index is 20.22 kg/m.  Intake/Output from previous day: 08/22 0701 - 08/23 0700 In: 1993 [P.O.:120; I.V.:1873] Out: 69 [Urine:50] Intake/Output this shift: Total I/O In: -  Out: 800 [Urine:800]  Examination:  General exam: Appears calm and comfortable, elderly, NAD. On RA. HEENT:PERRL,Oral mucosa moist, Ear/Nose normal on gross exam. Respiratory system: Bilateral equal air entry, normal vesicular breath sounds.  Cardiovascular system: RRR, S1 & S2 heard,No JVD, murmurs. Gastrointestinal system: Abdomen is  soft, non tender, non distended, BS+.  Nervous System:Alert and oriented. No focal neurological deficits/moving extremities, sensation intact. Extremities: No edema,  no clubbing, distal peripheral pulses palpable. Skin: No rashes, lesions, no icterus. MSK: Normal muscle bulk,tone,power  Medications: Scheduled Meds: . aspirin EC  81 mg Oral Daily  . dutasteride  0.5 mg Oral q morning - 10a   Continuous Infusions: . sodium chloride    . heparin 900 Units/hr (04/06/19 2118)    Data Reviewed: I have personally reviewed following labs and imaging studies  CBC: Recent Labs  Lab 04/05/19 1802 04/06/19 0617 04/07/19 0318  WBC 8.5 8.5 12.5*  NEUTROABS 6.1 6.7  --   HGB 14.9 14.4 13.7  HCT 44.4 42.6 40.8  MCV 96.5 95.1 95.3  PLT 182 185 956   Basic Metabolic Panel: Recent Labs  Lab 04/05/19 1802 04/06/19 0617 04/07/19 0318  NA 141 139 140  K 5.2* 4.0 4.5  CL 110 108 111  CO2 20* 21* 19*  GLUCOSE 124* 117* 125*  BUN 68* 64* 68*  CREATININE 3.34* 3.05* 4.69*  CALCIUM 8.5* 8.4* 8.3*  MG 2.2  --   --    GFR: Estimated Creatinine Clearance: 8.6 mL/min (A) (by C-G formula based on SCr of 4.69 mg/dL (H)). Liver Function Tests: Recent Labs  Lab 04/06/19 [phone]  AST 19  ALT 16  ALKPHOS 67  BILITOT 2.0*  PROT 5.5*  ALBUMIN 3.3*   No results for input(s): LIPASE, AMYLASE in the last 168 hours. No results for input(s): AMMONIA in the last 168 hours. Coagulation Profile: No results for input(s): INR, PROTIME in the last 168 hours. Cardiac Enzymes: No results for input(s): CKTOTAL, CKMB, CKMBINDEX, TROPONINI in the last 168 hours. BNP (last 3 results) No results for input(s): PROBNP in the last 8760 hours. HbA1C: No results for input(s): HGBA1C in the last 72 hours. CBG: Recent Labs  Lab 04/06/19 2121  GLUCAP 122*   Lipid Profile: No results for input(s): CHOL, HDL, LDLCALC, TRIG, CHOLHDL, LDLDIRECT in the last 72 hours. Thyroid Function Tests: Recent Labs    04/06/19 0617  TSH 1.278   Anemia Panel: No results for input(s): VITAMINB12, FOLATE, FERRITIN, TIBC, IRON, RETICCTPCT in the last 72 hours. Sepsis Labs: No results  for input(s): PROCALCITON, LATICACIDVEN in the last 168 hours.  Recent Results (from the past 240 hour(s))  SARS CORONAVIRUS 2 Nasal Swab Aptima Multi Swab     Status: None   Collection Time: 04/05/19  8:50 PM   Specimen: Aptima Multi Swab; Nasal Swab  Result Value Ref Range Status   SARS Coronavirus 2 NEGATIVE NEGATIVE Final    Comment: (NOTE) SARS-CoV-2 target nucleic acids are NOT DETECTED. The SARS-CoV-2 RNA is generally detectable in upper and lower respiratory specimens during the acute phase of infection. Negative results do not preclude SARS-CoV-2 infection, do not rule out co-infections with other pathogens, and should not be used as the sole basis for treatment or other patient management decisions. Negative results must be combined with clinical observations, patient history, and epidemiological information. The expected result is Negative. Fact Sheet for Patients: SugarRoll.be Fact Sheet for Healthcare Providers: https://www.woods-mathews.com/ This test is not yet approved or cleared by the [place] FDA and  has been authorized for detection and/or diagnosis of SARS-CoV-2 by FDA under an Emergency Use Authorization (EUA). This EUA will remain  in effect (meaning this test can be used) for the duration of the COVID-19 declaration under Section 56 4(b)(1) of the Act, 21 U.S.C. section 360bbb-3(b)(1), unless the authorization is terminated or revoked sooner. Performed at [facility], [place] [address], [place], [place] [address]       Radiology Studies: Dg Chest Portable 1 View  Result Date: 04/05/2019 CLINICAL DATA:  Reason for exam: sob Patient denies any sob or chest pains. Slight cough. Reports weakness for 3 days, and loss of appetite. No prior heart or lung conditions, hx of cancer unspecified type. No prior heart or lung surgeries. Non.*comment was truncated* EXAM: PORTABLE CHEST 1 VIEW COMPARISON:  08/16/2013  FINDINGS: Heart size is normal. Aorta is tortuous. There are no focal consolidations or pleural effusions. No pulmonary edema. IMPRESSION: No active disease. Electronically Signed   By: Nolon Nations M.D.   On: 04/05/2019 18:54      LOS: 1 day   Time spent: More than 50% of that time was spent in counseling and/or coordination of care.  Antonieta Pert, MD [facility]  04/07/2019, 11:49 AM

## 2019-04-07 NOTE — Progress Notes (Signed)
In and out cath done. Tolerated well. Urine out = 800 ml yellow/ concentrated appearing urine.

## 2019-04-07 NOTE — Progress Notes (Signed)
Pt BP 94/57 this am and no void overnight.  Assisted pt to standing position to void scant amount.  HR 140s when standing.  Pt asymptomatic.  Bladder scan revealed > 600cc.  Cr. 4.69 this am also.  NP paged and awaiting further orders.  Will cont to monitor pt closely.

## 2019-04-07 NOTE — Progress Notes (Signed)
ANTICOAGULATION CONSULT NOTE - Follow Up Consult  Pharmacy Consult for heparin Indication: atrial fibrillation  No Known Allergies  Patient Measurements: Height: 5\' 11"  (180.3 cm) Weight: 145 lb (65.8 kg) IBW/kg (Calculated) : 75.3 Heparin Dosing Weight: 65.8  Vital Signs: Temp: 98.2 F (36.8 C) (08/23 0640) Temp Source: Oral (08/23 0640) BP: 94/57 (08/23 0640) Pulse Rate: 72 (08/23 0640)  Labs: Recent Labs    04/05/19 1802 04/05/19 2228 04/06/19 0617  04/06/19 1751 04/07/19 0318 04/07/19 1045  HGB 14.9  --  14.4  --   --  13.7  --   HCT 44.4  --  42.6  --   --  40.8  --   PLT 182  --  185  --   --  178  --   HEPARINUNFRC  --   --   --    < > 0.89* 0.56 0.69  CREATININE 3.34*  --  3.05*  --   --  4.69*  --   TROPONINIHS 39* 44*  --   --   --   --   --    < > = values in this interval not displayed.    Estimated Creatinine Clearance: 8.6 mL/min (A) (by C-G formula based on SCr of 4.69 mg/dL (H)).   Medications:  Scheduled:  . aspirin EC  81 mg Oral Daily  . dutasteride  0.5 mg Oral q morning - 10a   Infusions:  . heparin 900 Units/hr (04/06/19 2118)    Assessment: 85yo male with PMH of HTN, HLD, and prostate cancer. Pt admitted on 8/21 for c/o dizziness and near-syncope x4d, found to be in [place] (new onset) w/ AKI. Pharmacy consulted to dose heparin. Pt was not on any anticoagulation PTA.   Heparin level at 1045 on 08/23 was therapeutic (0.69) on heparin infusion rate of 900 units/hr. Level was drawn appropriately. No signs/syptoms of bleeding or problems with infusion per RN.  Goal of Therapy:  Heparin level 0.3-0.7 units/ml Monitor platelets by anticoagulation protocol: Yes   Plan:  Since Heparin level on the high end of therapeutic range, will slightly reduce heparin infusion rate to 850 units/hr. Check heparin level and CBC daily. Continue to monitor H&H and platelets. Monitor for signs/symptoms of bleeding.  Sherren Kerns, PharmD PGY1 Acute Care  Pharmacy Resident [phone] 04/07/2019,11:42 AM

## 2019-04-07 NOTE — Progress Notes (Signed)
Large amt of hematuria in bed, app 200 ml bright red blood wiith some clots. Pt unaware of bleeding. Denies SOB,, dizziness, pain or other c/o. Bladder scan = 500 ml. Dr Lupita Leash notified. Urology consulted.

## 2019-04-07 NOTE — Progress Notes (Signed)
Converted to SR from Afib with HR in 30's initially and now stable in 60's with occ PAC's. Dr Lupita Leash and  Glenford Peers NP with Cards notified. Will get EKG.

## 2019-04-07 NOTE — Consult Note (Addendum)
Renal Service Consult Note [facility]  CHARLEE WHITEBREAD 04/07/2019 Sol Blazing Requesting Physician:  Dr Maren Beach  Reason for Consult:  AKI HPI: The patient is a 85 y.o. year-old with hx of HL, HTN presented on 8/21 w/ c/o dizziness and near-syncope x 4 days. Aflutter noted by EMS, BP 90./60 improved w/ NS bolus. ED w/u showed EKG w/ atrial flutter , new for patient. Creat 3.3 (last in 2015 was 2). BNP 65.  Pt was admitted and started on IVF's. Heparin was started for afib. Hx of prostate Ca.  Asked to see for renal failure.   Today creat is up to 4.69, BUN 68 and CO2 19, Na 140 and K 4.5. Ca 8.3. Hb 13  WBC 12  plt 178.  He got 1.25 L salien bolus yest and NS at 75 / hr overnight. ACEi was held on admit.  BP's remain low in the 90's.  100% on RA.   Pt denies any recent fatigue, anorexia, nausea or vomiting. Has had intermittent diarrhea. No fevers, chills, no SOB or leg swelling, no abd swelling, no nsaids.  No voiding issues. Prior hx prostate Ca, "in the past".   Denies hx of kidney problems "until here lately".  Has a PCP, does not have a kidney doctor. Only prior creat is one from 2015 = 2.0 .    ROS  denies CP  no joint pain   no HA  no blurry vision  no rash  no nausea/ vomiting  no dysuria  no difficulty voiding  no change in urine color    Past Medical History  Past Medical History:  Diagnosis Date  . Cancer ([place])   . Thyroid disease    Past Surgical History  Past Surgical History:  Procedure Laterality Date  . THYROID SURGERY     Family History  Family History  Family history unknown: Yes   Social History  reports that he has never smoked. He has never used smokeless tobacco. He reports current alcohol use. He reports that he does not use drugs. Allergies No Known Allergies Home medications Prior to Admission medications   Medication Sig Start Date End Date Taking? Authorizing Provider  dutasteride (AVODART) 0.5 MG capsule Take 0.5 mg by  mouth every morning.    Yes [provider]  lisinopril (ZESTRIL) 2.5 MG tablet Take 2.5 mg by mouth every morning.    Yes [provider]  simvastatin (ZOCOR) 20 MG tablet Take 20 mg by mouth every morning.    Yes [provider]  triamterene-hydrochlorothiazide (MAXZIDE-25) 37.5-25 MG tablet Take 1 tablet by mouth every morning.   Yes [provider]   Liver Function Tests Recent Labs  Lab 04/06/19 0617  AST 19  ALT 16  ALKPHOS 67  BILITOT 2.0*  PROT 5.5*  ALBUMIN 3.3*   No results for input(s): LIPASE, AMYLASE in the last 168 hours. CBC Recent Labs  Lab 04/05/19 1802 04/06/19 0617 04/07/19 0318  WBC 8.5 8.5 12.5*  NEUTROABS 6.1 6.7  --   HGB 14.9 14.4 13.7  HCT 44.4 42.6 40.8  MCV 96.5 95.1 95.3  PLT 182 185 132   Basic Metabolic Panel Recent Labs  Lab 04/05/19 1802 04/06/19 0617 04/07/19 0318  NA 141 139 140  K 5.2* 4.0 4.5  CL 110 108 111  CO2 20* 21* 19*  GLUCOSE 124* 117* 125*  BUN 68* 64* 68*  CREATININE 3.34* 3.05* 4.69*  CALCIUM 8.5* 8.4* 8.3*   Iron/TIBC/Ferritin/ %Sat  No results found for: IRON, TIBC, FERRITIN, IRONPCTSAT  Vitals:   04/06/19 1458 04/06/19 2033 04/07/19 0640 04/07/19 1314  BP: 128/70 (!) 97/50 (!) 94/57 (!) 90/48  Pulse: 78 78 72 74  Resp: 20 16 18    Temp: 97.8 F (36.6 C) 97.9 F (36.6 C) 98.2 F (36.8 C) 98.7 F (37.1 C)  TempSrc: Oral Oral Oral Oral  SpO2: 97% 96% 97% 100%  Weight:      Height:        Exam Gen awake, no distress, elderly WM No rash, cyanosis or gangrene Sclera anicteric, throat clear  No jvd or bruits Chest clear bilat to bases, no rales or wheezing RRR no MRG Abd soft ntnd no mass or ascites +bs GU normal male MS no joint effusions or deformity Ext no LE or UE edema, no wounds or ulcers Neuro is alert, Ox 3 , nf, no asterixis    Home meds:  - triamterene-hydrochlorothiazide 37.5-25mg  qd/ lisinopril 2.5 am  - simvastatin 20 am  - dutasteride 0.5  qam  Renal US > IMPRESSION: 1. Cortical atrophy of right kidney , 10.2cm,  with potential component of dilatation of the renal pelvis and proximal ureter. 2. Echogenic left kidney , 8.9 cm with 3 cm interpolar simple cyst. 3. Moderately distended trabeculated bladder with potential small amount of intraluminal debris.       CXR 8/21 - no active disease   Assessment/ Plan: 1. AKI on CKD - unclear baseline, in 2015 creat was 2.0.  AKI likely from ACEi/ hypotension / dehydration. Renal US shows chronic changes. BP's still soft, will give more boluses and cont IVF"s.  Cont to hold ACEi/ diuretics. Bladder is enlarge and trabeculated, get bladder scan post void. Get UA and lytes. No uremic signs. No indication for RRT.  2. Hypotension - vol depletion, r/o sepsis. Doesn't really look septic.  3. HTN/volume - holding meds, still appears dry on exam 4. H/o prostate Ca 5. HL 6. Atrial flutter - new issues, on IV hep, rate controlled w/o meds      Kelly Splinter  MD 04/07/2019, 3:01 PM

## 2019-04-07 NOTE — Progress Notes (Signed)
Progress Note  Patient Name: Corey Small Date of Encounter: 04/07/2019  Primary Cardiologist: No primary care provider on file.   Subjective   Overall feeling well.  No chest pain or shortness of breath.  Inpatient Medications    Scheduled Meds: . aspirin EC  81 mg Oral Daily   Continuous Infusions: . heparin 900 Units/hr (04/06/19 2118)   PRN Meds: acetaminophen **OR** acetaminophen, ondansetron **OR** ondansetron (ZOFRAN) IV   Vital Signs    Vitals:   04/06/19 0358 04/06/19 1458 04/06/19 2033 04/07/19 0640  BP: 132/75 128/70 (!) 97/50 (!) 94/57  Pulse: 76 78 78 72  Resp: 18 20 16 18   Temp: 98.5 F (36.9 C) 97.8 F (36.6 C) 97.9 F (36.6 C) 98.2 F (36.8 C)  TempSrc: Oral Oral Oral Oral  SpO2: 91% 97% 96% 97%  Weight: 65.8 kg     Height: 5\' 11"  (1.803 m)       Intake/Output Summary (Last 24 hours) at 04/07/2019 0807 Last data filed at 04/07/2019 0700 Gross per 24 hour  Intake 1992.96 ml  Output 50 ml  Net 1942.96 ml   Last 3 Weights 04/06/2019 04/05/2019 08/16/2013  Weight (lbs) 145 lb 170 lb 183 lb  Weight (kg) 65.772 kg 77.111 kg 83.008 kg      Telemetry    Atrial flutter- Personally Reviewed  ECG    None new- Personally Reviewed  Physical Exam   GEN: No acute distress.   Neck: No JVD Cardiac: RRR, no murmurs, rubs, or gallops.  Respiratory: Clear to auscultation bilaterally. GI: Soft, nontender, non-distended  MS: No edema; No deformity. Neuro:  Nonfocal  Psych: Normal affect   Labs    High Sensitivity Troponin:   Recent Labs  Lab 04/05/19 1802 04/05/19 2228  TROPONINIHS 39* 44*      Cardiac EnzymesNo results for input(s): TROPONINI in the last 168 hours. No results for input(s): TROPIPOC in the last 168 hours.   Chemistry Recent Labs  Lab 04/05/19 1802 04/06/19 0617 04/07/19 0318  NA 141 139 140  K 5.2* 4.0 4.5  CL 110 108 111  CO2 20* 21* 19*  GLUCOSE 124* 117* 125*  BUN 68* 64* 68*  CREATININE 3.34* 3.05* 4.69*   CALCIUM 8.5* 8.4* 8.3*  PROT  --  5.5*  --   ALBUMIN  --  3.3*  --   AST  --  19  --   ALT  --  16  --   ALKPHOS  --  67  --   BILITOT  --  2.0*  --   GFRNONAA 15* 16* 10*  GFRAA 17* 19* 11*  ANIONGAP 11 10 10      Hematology Recent Labs  Lab 04/05/19 1802 04/06/19 0617 04/07/19 0318  WBC 8.5 8.5 12.5*  RBC 4.60 4.48 4.28  HGB 14.9 14.4 13.7  HCT 44.4 42.6 40.8  MCV 96.5 95.1 95.3  MCH 32.4 32.1 32.0  MCHC 33.6 33.8 33.6  RDW 13.5 13.2 13.4  PLT 182 185 178    BNP Recent Labs  Lab 04/05/19 1803  BNP 65.4     DDimer No results for input(s): DDIMER in the last 168 hours.   Radiology    Dg Chest Portable 1 View  Result Date: 04/05/2019 CLINICAL DATA:  Reason for exam: sob Patient denies any sob or chest pains. Slight cough. Reports weakness for 3 days, and loss of appetite. No prior heart or lung conditions, hx of cancer unspecified type. No prior heart or  lung surgeries. Non.*comment was truncated* EXAM: PORTABLE CHEST 1 VIEW COMPARISON:  08/16/2013 FINDINGS: Heart size is normal. Aorta is tortuous. There are no focal consolidations or pleural effusions. No pulmonary edema. IMPRESSION: No active disease. Electronically Signed   By: Nolon Nations M.D.   On: 04/05/2019 18:54    Cardiac Studies   TTE   1. The left ventricle has normal systolic function with an ejection fraction of 60-65%. The cavity size was normal. Left ventricular diastolic function could not be evaluated secondary to atrial fibrillation.  2. The right ventricle has normal systolic function. The cavity was normal. There is no increase in right ventricular wall thickness.  3. The aortic valve is abnormal. Mild thickening of the aortic valve. Mild calcification of the aortic valve. Aortic valve regurgitation is trivial by color flow Doppler.  4. The aorta is normal unless otherwise noted.  5. The aortic root and ascending aorta are normal in size and structure.  6. The atrial septum is grossly  normal.  Patient Profile     85 y.o. male history of hypertension, hyperlipidemia, prostate cancer who presented to the hospital with weakness and fatigue, found to be in acute renal failure as well as  atrial flutter.  Assessment & Plan    1. Typical appearing atrial flutter: A flutter was found incidentally.  He is minimally symptomatic from this.  Agree that a rate control strategy is a reasonable option.  He Mersedes Alber likely need outpatient anticoagulation, though heparin is reasonable as he is in acute renal failure.    This patients CHA2DS2-VASc Score and unadjusted Ischemic Stroke Rate (% per year) is equal to 3.2 % stroke rate/year from a score of 3  Above score calculated as 1 point each if present [CHF, HTN, DM, Vascular=MI/PAD/Aortic Plaque, Age if 65-74, or Male] Above score calculated as 2 points each if present [Age > 75, or Stroke/TIA/TE]   2. Acute renal failure: Improved with IV fluids.  Unfortunately has increased since that time.  Is net +2.4 L.  Blaise Grieshaber potentially need nephrology consult.  Plan per primary team. 3. Weakness/fatigue: Likely due to dehydration and poor p.o. intake.  Plan per primary team.     For questions or updates, please contact [place] Please consult [email or web address] for contact info under        Signed, Kallista Pae Meredith Leeds, MD  04/07/2019, 8:07 AM

## 2019-04-07 NOTE — Progress Notes (Signed)
ANTICOAGULATION CONSULT NOTE - Follow Up Consult  Pharmacy Consult for heparin Indication: atrial fibrillation  Labs: Recent Labs    04/05/19 1802 04/05/19 2228 04/06/19 0617 04/06/19 0731 04/06/19 1751 04/07/19 0318  HGB 14.9  --  14.4  --   --  13.7  HCT 44.4  --  42.6  --   --  40.8  PLT 182  --  185  --   --  178  HEPARINUNFRC  --   --   --  0.62 0.89* 0.56  CREATININE 3.34*  --  3.05*  --   --   --   TROPONINIHS 39* 44*  --   --   --   --     Assessment/Plan:  85yo male therapeutic on heparin after rate change. Will continue gtt at current rate and confirm stable with additional level.   Wynona Neat, PharmD, BCPS  04/07/2019,5:27 AM

## 2019-04-07 NOTE — Progress Notes (Signed)
600 ml bloody urine out s/p foley catheter insertion ordered by Urology. Dr. Diona Fanti notified. Per MD, heparin gtt should be continued and pt will be seen in the am. Dr. Lupita Leash made aware.

## 2019-04-07 NOTE — Consult Note (Signed)
Urology Consult  Consulting MD: Antonieta Pert  CC: Blood in urine  HPI: This is a 85year old male admitted for atrial arrhythmia.  He presented on the 21st of this month with dizziness/syncope.  Because of A. fib, he was placed on heparin.  The patient had an in and out catheterization earlier today and has had blood coming from his penis.  There is apparently no history of longstanding gross hematuria.  Recent renal ultrasound revealed renal atrophy bilaterally with a somewhat distended bladder with trabeculation and some sediment layering posteriorly.  He does have a remote history of treatment for prostate cancer.  Urologic consultation is requested.  PMH: Past Medical History:  Diagnosis Date  . Cancer ([place])   . Thyroid disease     PSH: Past Surgical History:  Procedure Laterality Date  . THYROID SURGERY      Allergies: No Known Allergies  Medications: Medications Prior to Admission  Medication Sig Dispense Refill Last Dose  . dutasteride (AVODART) 0.5 MG capsule Take 0.5 mg by mouth every morning.    04/05/2019 at am  . lisinopril (ZESTRIL) 2.5 MG tablet Take 2.5 mg by mouth every morning.    04/05/2019 at am  . simvastatin (ZOCOR) 20 MG tablet Take 20 mg by mouth every morning.    04/05/2019 at am  . triamterene-hydrochlorothiazide (MAXZIDE-25) 37.5-25 MG tablet Take 1 tablet by mouth every morning.   04/05/2019 at am     Social History: Social History   Socioeconomic History  . Marital status: Married    Spouse name: Not on file  . Number of children: Not on file  . Years of education: Not on file  . Highest education level: Not on file  Occupational History  . Not on file  Social Needs  . Financial resource strain: Not on file  . Food insecurity    Worry: Not on file    Inability: Not on file  . Transportation needs    Medical: Not on file    Non-medical: Not on file  Tobacco Use  . Smoking status: Never Smoker  . Smokeless tobacco: Never Used  Substance  and Sexual Activity  . Alcohol use: Yes  . Drug use: No  . Sexual activity: Not on file  Lifestyle  . Physical activity    Days per week: Not on file    Minutes per session: Not on file  . Stress: Not on file  Relationships  . Social Herbalist on phone: Not on file    Gets together: Not on file    Attends religious service: Not on file    Active member of club or organization: Not on file    Attends meetings of clubs or organizations: Not on file    Relationship status: Not on file  . Intimate partner violence    Fear of current or ex partner: Not on file    Emotionally abused: Not on file    Physically abused: Not on file    Forced sexual activity: Not on file  Other Topics Concern  . Not on file  Social History Narrative  . Not on file    Family History: Family History  Family history unknown: Yes    Review of Systems: Not taken    Studies:  Recent Labs    04/06/19 0617 04/07/19 0318  HGB 14.4 13.7  WBC 8.5 12.5*  PLT 185 178    Recent Labs    04/06/19 0617 04/07/19 0318  NA 139 140  K 4.0 4.5  CL 108 111  CO2 21* 19*  BUN 64* 68*  CREATININE 3.05* 4.69*  CALCIUM 8.4* 8.3*  GFRNONAA 16* 10*  GFRAA 19* 11*     No results for input(s): INR, APTT in the last 72 hours.  Invalid input(s): PT   Invalid input(s): ABG  I reviewed the patient's renal ultrasound imaging.  Assessment: Gross hematuria, apparently blood coming from the penis with no longstanding history of gross hematuria.  He does have a history of prostate cancer, although I do not know if this was treated surgically or with radiation.  If radiation, this may increase the risk of bleeding with catheter placement.    Plan: I would not stop his heparin at this point.  I would recommend placing a catheter and have ordered this.  If overall his urine is clear coming from the catheter, we know that the bleeding is coming from the prostate or below, and hopefully will  stop/tamponade with catheter placement.  If significant blood persists in his urine through the catheter, it would be necessary, saying that I do not think he has any true bladder lesions on his ultrasound, to discontinue the heparin.  I will follow-up on this.    Pager:[phone]

## 2019-04-08 DIAGNOSIS — L89152 Pressure ulcer of sacral region, stage 2: Secondary | ICD-10-CM

## 2019-04-08 DIAGNOSIS — N183 Chronic kidney disease, stage 3 (moderate): Secondary | ICD-10-CM

## 2019-04-08 DIAGNOSIS — N179 Acute kidney failure, unspecified: Principal | ICD-10-CM

## 2019-04-08 DIAGNOSIS — I10 Essential (primary) hypertension: Secondary | ICD-10-CM

## 2019-04-08 LAB — BASIC METABOLIC PANEL
Anion gap: 6 (ref 5–15)
BUN: 51 mg/dL — ABNORMAL HIGH (ref 8–23)
CO2: 21 mmol/L — ABNORMAL LOW (ref 22–32)
Calcium: 7.7 mg/dL — ABNORMAL LOW (ref 8.9–10.3)
Chloride: 115 mmol/L — ABNORMAL HIGH (ref 98–111)
Creatinine, Ser: 2.52 mg/dL — ABNORMAL HIGH (ref 0.61–1.24)
GFR calc Af Amer: 24 mL/min — ABNORMAL LOW (ref >60)
GFR calc non Af Amer: 21 mL/min — ABNORMAL LOW (ref >60)
Glucose, Bld: 111 mg/dL — ABNORMAL HIGH (ref 70–99)
Potassium: 3.8 mmol/L (ref 3.5–5.1)
Sodium: 142 mmol/L (ref 135–145)

## 2019-04-08 LAB — CBC
HCT: 32.1 % — ABNORMAL LOW (ref 39.0–52.0)
HCT: 33.2 % — ABNORMAL LOW (ref 39.0–52.0)
Hemoglobin: 11 g/dL — ABNORMAL LOW (ref 13.0–17.0)
Hemoglobin: 11.3 g/dL — ABNORMAL LOW (ref 13.0–17.0)
MCH: 32.6 pg (ref 26.0–34.0)
MCH: 32.8 pg (ref 26.0–34.0)
MCHC: 34.3 g/dL (ref 30.0–36.0)
MCHC: 34 g/dL (ref 30.0–36.0)
MCV: 95.3 fL (ref 80.0–100.0)
MCV: 96.2 fL (ref 80.0–100.0)
Platelets: 155 10*3/uL (ref 150–400)
Platelets: 158 10*3/uL (ref 150–400)
RBC: 3.37 MIL/uL — ABNORMAL LOW (ref 4.22–5.81)
RBC: 3.45 MIL/uL — ABNORMAL LOW (ref 4.22–5.81)
RDW: 13.5 % (ref 11.5–15.5)
RDW: 13.6 % (ref 11.5–15.5)
WBC: 7.4 10*3/uL (ref 4.0–10.5)
WBC: 7.1 10*3/uL (ref 4.0–10.5)
nRBC: 0 % (ref 0.0–0.2)
nRBC: 0 % (ref 0.0–0.2)

## 2019-04-08 LAB — HEMOGLOBIN AND HEMATOCRIT, BLOOD
HCT: 33 % — ABNORMAL LOW (ref 39.0–52.0)
Hemoglobin: 11.1 g/dL — ABNORMAL LOW (ref 13.0–17.0)

## 2019-04-08 LAB — HEPARIN LEVEL (UNFRACTIONATED): Heparin Unfractionated: 0.29 IU/mL — ABNORMAL LOW (ref 0.30–0.70)

## 2019-04-08 NOTE — Progress Notes (Signed)
[facility] ROUNDING NOTE   Subjective:   This is a very pleasant 85 year old gentleman with a history of renal insufficiency appears that his creatinine was 3.3 his last creatinine 2015 was 2.0.  He does have a history of prostate cancer.  He was admitted 04/05/2019 with dizziness and near syncope.  He has a history of atrial flutter and a blood pressure that was 90/60 on admission is responded to fluid bolus  Blood pressure 114/61 pulse 72 temperature 98.2 O2 sats 98% room air urine output 1.5 L  Sodium 142 potassium 3.8 chloride 115 CO2 21 BUN 51 creatinine 2.52 glucose 111 WBC six 7.1 hemoglobin 11.3 platelets 153  Renal ultrasound showed cortical atrophy of the right kidney echogenic left kidney with a 3 cm interpolar simple cyst and a moderately distended trabeculated bladder   Aspirin 81 mg daily Avodart 0.5 mg daily  .  Objective:  Vital signs in last 24 hours:  Temp:  [98.2 F (36.8 C)-98.7 F (37.1 C)] 98.2 F (36.8 C) (08/24 0521) Pulse Rate:  [74-90] 78 (08/24 0521) BP: (90-114)/(48-74) 114/61 (08/24 0521) SpO2:  [90 %-100 %] 98 % (08/24 0521) Weight:  [69.4 kg] 69.4 kg (08/24 0521)  Weight change:  Filed Weights   04/05/19 1802 04/06/19 0358 04/08/19 0521  Weight: 77.1 kg 65.8 kg 69.4 kg    Intake/Output: I/O last 3 completed shifts: In: 2505 [P.O.:720; I.V.:1785] Out: 2450 [Urine:2450]   Intake/Output this shift:  Total I/O In: 300 [P.O.:300] Out: -   Gen awake, no distress, elderly WM No rash, cyanosis or gangrene Sclera anicteric, throat clear  No jvd or bruits Chest clear bilat to bases, no rales or wheezing RRR no MRG Abd soft ntnd no mass or ascites +bs GU normal male MS no joint effusions or deformity Ext no LE or UE edema, no wounds or ulcers Neuro is alert, Ox 3 , nf, no asterixis   Basic Metabolic Panel: Recent Labs  Lab 04/05/19 1802 04/06/19 0617 04/07/19 0318 04/08/19 0651  NA 141 139 140 142  K 5.2* 4.0 4.5 3.8   CL 110 108 111 115*  CO2 20* 21* 19* 21*  GLUCOSE 124* 117* 125* 111*  BUN 68* 64* 68* 51*  CREATININE 3.34* 3.05* 4.69* 2.52*  CALCIUM 8.5* 8.4* 8.3* 7.7*  MG 2.2  --   --   --     Liver Function Tests: Recent Labs  Lab 04/06/19 0617  AST 19  ALT 16  ALKPHOS 67  BILITOT 2.0*  PROT 5.5*  ALBUMIN 3.3*   No results for input(s): LIPASE, AMYLASE in the last 168 hours. No results for input(s): AMMONIA in the last 168 hours.  CBC: Recent Labs  Lab 04/05/19 1802 04/06/19 0617 04/07/19 0318 04/07/19 1818 04/08/19 0018 04/08/19 0651 04/08/19 1035  WBC 8.5 8.5 12.5*  --   --  7.4 7.1  NEUTROABS 6.1 6.7  --   --   --   --   --   HGB 14.9 14.4 13.7 12.8* 11.1* 11.0* 11.3*  HCT 44.4 42.6 40.8 37.6* 33.0* 32.1* 33.2*  MCV 96.5 95.1 95.3  --   --  95.3 96.2  PLT 182 185 178  --   --  155 158    Cardiac Enzymes: No results for input(s): CKTOTAL, CKMB, CKMBINDEX, TROPONINI in the last 168 hours.  BNP: Invalid input(s): POCBNP  CBG: Recent Labs  Lab 04/06/19 2121  GLUCAP 122*    Microbiology: Results for orders placed or performed during  the hospital encounter of 04/05/19  SARS CORONAVIRUS 2 Nasal Swab Aptima Multi Swab     Status: None   Collection Time: 04/05/19  8:50 PM   Specimen: Aptima Multi Swab; Nasal Swab  Result Value Ref Range Status   SARS Coronavirus 2 NEGATIVE NEGATIVE Final    Comment: (NOTE) SARS-CoV-2 target nucleic acids are NOT DETECTED. The SARS-CoV-2 RNA is generally detectable in upper and lower respiratory specimens during the acute phase of infection. Negative results do not preclude SARS-CoV-2 infection, do not rule out co-infections with other pathogens, and should not be used as the sole basis for treatment or other patient management decisions. Negative results must be combined with clinical observations, patient history, and epidemiological information. The expected result is Negative. Fact Sheet for  Patients: SugarRoll.be Fact Sheet for Healthcare Providers: https://www.woods-mathews.com/ This test is not yet approved or cleared by the [place] FDA and  has been authorized for detection and/or diagnosis of SARS-CoV-2 by FDA under an Emergency Use Authorization (EUA). This EUA will remain  in effect (meaning this test can be used) for the duration of the COVID-19 declaration under Section 56 4(b)(1) of the Act, 21 U.S.C. section 360bbb-3(b)(1), unless the authorization is terminated or revoked sooner. Performed at [facility], [address], [place], [place] [address]     Coagulation Studies: No results for input(s): LABPROT, INR in the last 72 hours.  Urinalysis: Recent Labs    04/07/19 2024  COLORURINE RED*  LABSPEC TEST NOT REPORTED DUE TO COLOR INTERFERENCE OF URINE PIGMENT  PHURINE TEST NOT REPORTED DUE TO COLOR INTERFERENCE OF URINE PIGMENT  GLUCOSEU TEST NOT REPORTED DUE TO COLOR INTERFERENCE OF URINE PIGMENT*  HGBUR TEST NOT REPORTED DUE TO COLOR INTERFERENCE OF URINE PIGMENT*  BILIRUBINUR TEST NOT REPORTED DUE TO COLOR INTERFERENCE OF URINE PIGMENT*  KETONESUR TEST NOT REPORTED DUE TO COLOR INTERFERENCE OF URINE PIGMENT*  PROTEINUR TEST NOT REPORTED DUE TO COLOR INTERFERENCE OF URINE PIGMENT*  NITRITE TEST NOT REPORTED DUE TO COLOR INTERFERENCE OF URINE PIGMENT*  LEUKOCYTESUR TEST NOT REPORTED DUE TO COLOR INTERFERENCE OF URINE PIGMENT*      Imaging: US Renal  Result Date: 04/07/2019 CLINICAL DATA:  Acute kidney injury. EXAM: RENAL / URINARY TRACT ULTRASOUND COMPLETE COMPARISON:  None. FINDINGS: Right Kidney: Renal measurements: 10.2 x 5.2 x 5.0 cm = volume: 140 mL. Cortical atrophy present. No hydronephrosis at the level of the kidney. The renal pelvis and proximal ureter may potentially be dilated. Left Kidney: Renal measurements: 8.9 x 7.7 x 4.0 cm = volume: 147 mL. Echogenic cortex with loss of  corticomedullary differentiation. Interpolar exophytic simple cyst measures 3 cm. No hydronephrosis. Bladder: Moderately distended bladder demonstrates trabeculated wall with potentially a small amount of intraluminal debris. IMPRESSION: 1. Cortical atrophy of right kidney with potential component of dilatation of the renal pelvis and proximal ureter. 2. Echogenic left kidney with 3 cm interpolar simple cyst. 3. Moderately distended trabeculated bladder with potential small amount of intraluminal debris. Electronically Signed   By: Aletta Edouard M.D.   On: 04/07/2019 14:12     Medications:   . sodium chloride 75 mL/hr at 04/08/19 0102   . aspirin EC  81 mg Oral Daily  . dutasteride  0.5 mg Oral q morning - 10a   acetaminophen **OR** acetaminophen, ondansetron **OR** ondansetron (ZOFRAN) IV  Assessment/ Plan:   Acute kidney injury on baseline of chronic kidney disease baseline serum creatinine which appears to be at 2.0 mg/dL.  AKI is  most likely the use of ACE inhibitor hypotension dehydration ultrasound did not show any evidence of hydronephrosis but he did have a trabeculated bladder.  With rehydration and stopping his nephrotoxic medications it appears that his creatinine appears to have improved.    Hypertension/volume appears to be stable we will continue hydration at 75 cc an hour and withholding any antihypertensive medications  Anemia stable not issue at this time  Hypotension secondary to medications and volume depletion.   LOS: 2 Sherril Croon @TODAY @12 :02 PM

## 2019-04-08 NOTE — Plan of Care (Signed)

## 2019-04-08 NOTE — Progress Notes (Addendum)
PROGRESS NOTE    Corey Small  WCB:[id number] DOB: September 08, 1919 DOA: 04/05/2019 PCP: Lorene Dy, MD    Brief Narrative:  85 year old male who presented with generalized weakness and dizziness.  He does have significant past medical history for hypertension, dyslipidemia prostate cancer, ckd stage IV.  Patient reported feeling weak and dizzy for about 4 days, worsening symptoms while standing, no chest pain or dyspnea.  EMS was called to his home, he was found hypotensive and received 500 cc of IV normal saline.  In the emergency department his blood pressure was 109/68, heart rate 80, respiratory rate 15, temperature 98.7, oxygen saturation 91% his lungs are clear to auscultation bilaterally, heart S1-S2 present, abdomen soft nontender, no lower extremity edema. Sodium 141, potassium 5.2, chloride 110, bicarb 20, glucose 124, BUN 68, creatinine 3.34, magnesium 2.2, troponin 39, white count 8.5, hemoglobin 14.9, hematocrit 44.4, platelets 182.  SARS COVID-19 was negative.  His urinalysis had 6-10 white cells, more than 50 red cells.  Chest x-ray had hyperinflation, bibasilar atelectasis, no infiltrates.  EKG 104 bpm, left axis deviation, atrial flutter 3-1 block, J-point elevation V2 to V3, no T wave changes.  Poor R wave progression.  Patient was admitted to the hospital working diagnosis of acute kidney injury complicated by new onset atrial flutter.   Assessment & Plan:   Principal Problem:   Atrial flutter ([place]) Active Problems:   AKI (acute kidney injury) ([place])   Essential hypertension   ARF (acute renal failure) ([facility])   Pressure injury of skin   1. AKI on CKD stage IV. Renal function is improving with conservative medical therapy, but not yet back to baseline. Serum cr today is 2,52 down from 4,60. Urine output is 2,400 ml over last 24 H. Will continue isotonic saline at 75 ml per H and will follow on renal panel in am. Avoid nephrotoxic medications and hypotension.  2. New  onset atrial flutter. Paroxysmal. Patient now has converted into sinus rhythm with bradycardia and sinus pauses up to 1.6 seconds, personally reviewed telemetry. Continue to hold on av blockers, and continue to follow on telemetry. Holding on anticoagulation due to hematuria and fall risk.  3. Hematuria with acute blood loss anemia. Hgb and Hct have been stable, no criteria for PRBC transfusion. Foley catheter in place, will follow with urology recommendations.   4. HTN. Continue to hold on antihypertensive agents for now, to prevent hypotension.    5. Stage 2 pressure ulcer sacrum, present on admission. Continue with local wound care.   DVT prophylaxis: enoxaparin   Code Status:  dnr  Family Communication: I spoke with patient's son at the bedside and all questions were addressed.  Disposition Plan/ discharge barriers: possible dc in am pending renal function and telemetry  Body mass index is 21.35 kg/m. Malnutrition Type:      Malnutrition Characteristics:      Nutrition Interventions:     RN Pressure Injury Documentation: Pressure Injury 04/06/19 Sacrum Stage II -  Partial thickness loss of dermis presenting as a shallow open ulcer with a red, pink wound bed without slough. (Active)  04/06/19 0800  Location: Sacrum  Location Orientation:   Staging: Stage II -  Partial thickness loss of dermis presenting as a shallow open ulcer with a red, pink wound bed without slough.  Wound Description (Comments):   Present on Admission: Yes     Consultants:   Nephrology   Cardiology   Procedures:     Antimicrobials:  Subjective: Patient continue to be very weak and deconditioned, no nausea or vomiting, improved po intake, no chest pain or dyspnea. At home with very poor oral intake. Positive ambulatory dysfunction.   Objective: Vitals:   04/07/19 1612 04/07/19 2104 04/07/19 2118 04/08/19 0521  BP: 113/74 (!) 113/53  114/61  Pulse: 90  89 78  Resp:      Temp:    98.6 F (37 C) 98.2 F (36.8 C)  TempSrc:   Oral Oral  SpO2: 90%  96% 98%  Weight:    69.4 kg  Height:        Intake/Output Summary (Last 24 hours) at 04/08/2019 1228 Last data filed at 04/08/2019 0900 Gross per 24 hour  Intake 1765.69 ml  Output 1600 ml  Net 165.69 ml   Filed Weights   04/05/19 1802 04/06/19 0358 04/08/19 0521  Weight: 77.1 kg 65.8 kg 69.4 kg    Examination:   General: deconditioned and ill looking appearing.  Neurology: Awake and alert, non focal  E ENT: mild pallor, no icterus, oral mucosa moist Cardiovascular: No JVD. S1-S2 present, rhythmic, no gallops, rubs, or murmurs. Trace lower extremity edema. Pulmonary: positive breath sounds bilaterally, adequate air movement, no wheezing, rhonchi or rales. Gastrointestinal. Abdomen with no organomegaly, non tender, no rebound or guarding Skin. No rashes Musculoskeletal: no joint deformities     Data Reviewed: I have personally reviewed following labs and imaging studies  CBC: Recent Labs  Lab 04/05/19 1802 04/06/19 0617 04/07/19 0318 04/07/19 1818 04/08/19 0018 04/08/19 0651 04/08/19 1035  WBC 8.5 8.5 12.5*  --   --  7.4 7.1  NEUTROABS 6.1 6.7  --   --   --   --   --   HGB 14.9 14.4 13.7 12.8* 11.1* 11.0* 11.3*  HCT 44.4 42.6 40.8 37.6* 33.0* 32.1* 33.2*  MCV 96.5 95.1 95.3  --   --  95.3 96.2  PLT 182 185 178  --   --  155 237   Basic Metabolic Panel: Recent Labs  Lab 04/05/19 1802 04/06/19 0617 04/07/19 0318 04/08/19 0651  NA 141 139 140 142  K 5.2* 4.0 4.5 3.8  CL 110 108 111 115*  CO2 20* 21* 19* 21*  GLUCOSE 124* 117* 125* 111*  BUN 68* 64* 68* 51*  CREATININE 3.34* 3.05* 4.69* 2.52*  CALCIUM 8.5* 8.4* 8.3* 7.7*  MG 2.2  --   --   --    GFR: Estimated Creatinine Clearance: 16.8 mL/min (A) (by C-G formula based on SCr of 2.52 mg/dL (H)). Liver Function Tests: Recent Labs  Lab 04/06/19 0617  AST 19  ALT 16  ALKPHOS 67  BILITOT 2.0*  PROT 5.5*  ALBUMIN 3.3*   No  results for input(s): LIPASE, AMYLASE in the last 168 hours. No results for input(s): AMMONIA in the last 168 hours. Coagulation Profile: No results for input(s): INR, PROTIME in the last 168 hours. Cardiac Enzymes: No results for input(s): CKTOTAL, CKMB, CKMBINDEX, TROPONINI in the last 168 hours. BNP (last 3 results) No results for input(s): PROBNP in the last 8760 hours. HbA1C: No results for input(s): HGBA1C in the last 72 hours. CBG: Recent Labs  Lab 04/06/19 2121  GLUCAP 122*   Lipid Profile: No results for input(s): CHOL, HDL, LDLCALC, TRIG, CHOLHDL, LDLDIRECT in the last 72 hours. Thyroid Function Tests: Recent Labs    04/06/19 0617  TSH 1.278   Anemia Panel: No results for input(s): VITAMINB12, FOLATE, FERRITIN, TIBC, IRON, RETICCTPCT in the last  72 hours.    Radiology Studies: I have reviewed all of the imaging during this hospital visit personally     Scheduled Meds: . aspirin EC  81 mg Oral Daily  . dutasteride  0.5 mg Oral q morning - 10a   Continuous Infusions: . sodium chloride 75 mL/hr at 04/08/19 0102     LOS: 2 days        Tawni Millers, MD

## 2019-04-08 NOTE — Progress Notes (Addendum)
Progress Note  Patient Name: Corey Small Date of Encounter: 04/08/2019  Primary Cardiologist: Skeet Latch, MD new  Subjective   Woke from sleep, no chest pain or any pain, no SOB, lying flat in bed.   Inpatient Medications    Scheduled Meds:  aspirin EC  81 mg Oral Daily   dutasteride  0.5 mg Oral q morning - 10a   Continuous Infusions:  sodium chloride 75 mL/hr at 04/08/19 0102   heparin 850 Units/hr (04/08/19 0104)   PRN Meds: acetaminophen **OR** acetaminophen, ondansetron **OR** ondansetron (ZOFRAN) IV   Vital Signs    Vitals:   04/07/19 1612 04/07/19 2104 04/07/19 2118 04/08/19 0521  BP: 113/74 (!) 113/53  114/61  Pulse: 90  89 78  Resp:      Temp:   98.6 F (37 C) 98.2 F (36.8 C)  TempSrc:   Oral Oral  SpO2: 90%  96% 98%  Weight:    69.4 kg  Height:        Intake/Output Summary (Last 24 hours) at 04/08/2019 0756 Last data filed at 04/08/2019 2248 Gross per 24 hour  Intake 1705.69 ml  Output 2400 ml  Net -694.31 ml   Last 3 Weights 04/08/2019 04/06/2019 04/05/2019  Weight (lbs) 153 lb 1.6 oz 145 lb 170 lb  Weight (kg) 69.446 kg 65.772 kg 77.111 kg      Telemetry    SR to SB at 30 some appear to be SR with non conducted PACs and some just sinus brady.    - Personally Reviewed  ECG     SR new from piror, with low voltage and nonspecific IVD.- Personally Reviewed  Physical Exam   GEN: No acute distress.   Neck: No JVD Cardiac: RRR, with pauses, no murmurs, rubs, or gallops.  Respiratory: Clear to auscultation bilaterally. GI: Soft, nontender, non-distended  MS: No edema; No deformity. Neuro:  Nonfocal  Psych: Normal affect  GU with gross hematuria in foley.  No pain.   Labs    High Sensitivity Troponin:   Recent Labs  Lab 04/05/19 1802 04/05/19 2228  TROPONINIHS 39* 44*      Cardiac EnzymesNo results for input(s): TROPONINI in the last 168 hours. No results for input(s): TROPIPOC in the last 168 hours.    Chemistry Recent Labs  Lab 04/06/19 0617 04/07/19 0318 04/08/19 0651  NA 139 140 142  K 4.0 4.5 3.8  CL 108 111 115*  CO2 21* 19* 21*  GLUCOSE 117* 125* 111*  BUN 64* 68* PENDING  CREATININE 3.05* 4.69* PENDING  CALCIUM 8.4* 8.3* 7.7*  PROT 5.5*  --   --   ALBUMIN 3.3*  --   --   AST 19  --   --   ALT 16  --   --   ALKPHOS 67  --   --   BILITOT 2.0*  --   --   GFRNONAA 16* 10* PENDING  GFRAA 19* 11* PENDING  ANIONGAP 10 10 6      Hematology Recent Labs  Lab 04/06/19 0617 04/07/19 0318 04/07/19 1818 04/08/19 0018 04/08/19 0651  WBC 8.5 12.5*  --   --  7.4  RBC 4.48 4.28  --   --  3.37*  HGB 14.4 13.7 12.8* 11.1* 11.0*  HCT 42.6 40.8 37.6* 33.0* 32.1*  MCV 95.1 95.3  --   --  95.3  MCH 32.1 32.0  --   --  32.6  MCHC 33.8 33.6  --   --  34.3  RDW 13.2 13.4  --   --  13.5  PLT 185 178  --   --  155    BNP Recent Labs  Lab 04/05/19 1803  BNP 65.4     DDimer No results for input(s): DDIMER in the last 168 hours.   Radiology    US Renal  Result Date: 04/07/2019 CLINICAL DATA:  Acute kidney injury. EXAM: RENAL / URINARY TRACT ULTRASOUND COMPLETE COMPARISON:  None. FINDINGS: Right Kidney: Renal measurements: 10.2 x 5.2 x 5.0 cm = volume: 140 mL. Cortical atrophy present. No hydronephrosis at the level of the kidney. The renal pelvis and proximal ureter may potentially be dilated. Left Kidney: Renal measurements: 8.9 x 7.7 x 4.0 cm = volume: 147 mL. Echogenic cortex with loss of corticomedullary differentiation. Interpolar exophytic simple cyst measures 3 cm. No hydronephrosis. Bladder: Moderately distended bladder demonstrates trabeculated wall with potentially a small amount of intraluminal debris. IMPRESSION: 1. Cortical atrophy of right kidney with potential component of dilatation of the renal pelvis and proximal ureter. 2. Echogenic left kidney with 3 cm interpolar simple cyst. 3. Moderately distended trabeculated bladder with potential small amount of  intraluminal debris. Electronically Signed   By: Aletta Edouard M.D.   On: 04/07/2019 14:12    Cardiac Studies   Echo 04/06/19  IMPRESSIONS    1. The left ventricle has normal systolic function with an ejection fraction of 60-65%. The cavity size was normal. Left ventricular diastolic function could not be evaluated secondary to atrial fibrillation.  2. The right ventricle has normal systolic function. The cavity was normal. There is no increase in right ventricular wall thickness.  3. The aortic valve is abnormal. Mild thickening of the aortic valve. Mild calcification of the aortic valve. Aortic valve regurgitation is trivial by color flow Doppler.  4. The aorta is normal unless otherwise noted.  5. The aortic root and ascending aorta are normal in size and structure.  6. The atrial septum is grossly normal.  FINDINGS  Left Ventricle: The left ventricle has normal systolic function, with an ejection fraction of 60-65%. The cavity size was normal. There is no increase in left ventricular wall thickness. Left ventricular diastolic function could not be evaluated  secondary to atrial fibrillation. Definity contrast agent was given IV to delineate the left ventricular endocardial borders.  Right Ventricle: The right ventricle has normal systolic function. The cavity was normal. There is no increase in right ventricular wall thickness.  Left Atrium: Left atrial size was normal in size.  Right Atrium: Right atrial size was normal in size. Right atrial pressure is estimated at 8 mmHg.  Interatrial Septum: The atrial septum is grossly normal.  Pericardium: There is no evidence of pericardial effusion.  Mitral Valve: The mitral valve is normal in structure. Mitral valve regurgitation is trivial by color flow Doppler.  Tricuspid Valve: The tricuspid valve is normal in structure. Tricuspid valve regurgitation was not visualized by color flow Doppler.  Aortic Valve: The aortic  valve is abnormal Mild thickening of the aortic valve. Mild calcification of the aortic valve. Aortic valve regurgitation is trivial by color flow Doppler.  Pulmonic Valve: The pulmonic valve was grossly normal. Pulmonic valve regurgitation is not visualized by color flow Doppler.  Aorta: The aortic root and ascending aorta are normal in size and structure. The aorta is normal unless otherwise noted.   Patient Profile     84 y.o. male with a hx of hypertension, hyperlipidemia, prostate cancer and admitted with  weakness and dizziness.  Found to be in a flutter and AKI due to dehydration- he was hydrated and placed on coumadin.  Assessment & Plan    Typical a flutter and placed on IV heparin and Coumadin with hematuria IV heparin to be  stopped and coumadin has not been started, only on ASA.  Now in SR  Did have HR to 30 yesterday SB around noon and off and on since HR to 30 occ 25, some with nonconducted PACs.  On no rate slowing meds.  TSH 1.278   Sinus Bradycardia. To 30 on no meds. ? EP consult  BP stable 096 systolic  Asymptomatic in bed.  AKI with dehydration given IV fluids seen by Renal, baseline Cr maybe 2.0, ACEi held, Cr pk of 4.69 now 2.52   Hematuria has been seen by GU , heparin stopped, foley placed. Hgb down from 13.7 to 11.  Stop heparin.   Hx prostate cancer.   Per GU       For questions or updates, please contact [place] Please consult [email or web address] for contact info under        Signed, Cecilie Kicks, NP  04/08/2019, 7:56 AM

## 2019-04-08 NOTE — Progress Notes (Signed)
Subjective: Patient sleeping, hard to arouse.  Objective: Vital signs in last 24 hours: Temp:  [98.2 F (36.8 C)-98.7 F (37.1 C)] 98.2 F (36.8 C) (08/24 0521) Pulse Rate:  [74-90] 78 (08/24 0521) BP: (90-114)/(48-74) 114/61 (08/24 0521) SpO2:  [90 %-100 %] 98 % (08/24 0521) Weight:  [69.4 kg] 69.4 kg (08/24 0521)  Intake/Output from previous day: 08/23 0701 - 08/24 0700 In: 600 [P.O.:600] Out: 2400 [Urine:2400] Intake/Output this shift: Total I/O In: -  Out: 1600 [Urine:1600]  Physical Exam:  Constitutional: Vital signs reviewed. Pulmonary/Chest: Normal effort Extremities: No cyanosis or edema   Lab Results: Recent Labs    04/07/19 0318 04/07/19 1818 04/08/19 0018  HGB 13.7 12.8* 11.1*  HCT 40.8 37.6* 33.0*   BMET Recent Labs    04/06/19 0617 04/07/19 0318  NA 139 140  K 4.0 4.5  CL 108 111  CO2 21* 19*  GLUCOSE 117* 125*  BUN 64* 68*  CREATININE 3.05* 4.69*  CALCIUM 8.4* 8.3*   No results for input(s): LABPT, INR in the last 72 hours. No results for input(s): LABURIN in the last 72 hours. Results for orders placed or performed during the hospital encounter of 04/05/19  SARS CORONAVIRUS 2 Nasal Swab Aptima Multi Swab     Status: None   Collection Time: 04/05/19  8:50 PM   Specimen: Aptima Multi Swab; Nasal Swab  Result Value Ref Range Status   SARS Coronavirus 2 NEGATIVE NEGATIVE Final    Comment: (NOTE) SARS-CoV-2 target nucleic acids are NOT DETECTED. The SARS-CoV-2 RNA is generally detectable in upper and lower respiratory specimens during the acute phase of infection. Negative results do not preclude SARS-CoV-2 infection, do not rule out co-infections with other pathogens, and should not be used as the sole basis for treatment or other patient management decisions. Negative results must be combined with clinical observations, patient history, and epidemiological information. The expected result is Negative. Fact Sheet for  Patients: SugarRoll.be Fact Sheet for Healthcare Providers: https://www.woods-mathews.com/ This test is not yet approved or cleared by the [place] FDA and  has been authorized for detection and/or diagnosis of SARS-CoV-2 by FDA under an Emergency Use Authorization (EUA). This EUA will remain  in effect (meaning this test can be used) for the duration of the COVID-19 declaration under Section 56 4(b)(1) of the Act, 21 U.S.C. section 360bbb-3(b)(1), unless the authorization is terminated or revoked sooner. Performed at [facility], [place] [address] [place], [place] [address]     Studies/Results: US Renal  Result Date: 04/07/2019 CLINICAL DATA:  Acute kidney injury. EXAM: RENAL / URINARY TRACT ULTRASOUND COMPLETE COMPARISON:  None. FINDINGS: Right Kidney: Renal measurements: 10.2 x 5.2 x 5.0 cm = volume: 140 mL. Cortical atrophy present. No hydronephrosis at the level of the kidney. The renal pelvis and proximal ureter may potentially be dilated. Left Kidney: Renal measurements: 8.9 x 7.7 x 4.0 cm = volume: 147 mL. Echogenic cortex with loss of corticomedullary differentiation. Interpolar exophytic simple cyst measures 3 cm. No hydronephrosis. Bladder: Moderately distended bladder demonstrates trabeculated wall with potentially a small amount of intraluminal debris. IMPRESSION: 1. Cortical atrophy of right kidney with potential component of dilatation of the renal pelvis and proximal ureter. 2. Echogenic left kidney with 3 cm interpolar simple cyst. 3. Moderately distended trabeculated bladder with potential small amount of intraluminal debris. Electronically Signed   By: Aletta Edouard M.D.   On: 04/07/2019 14:12    Assessment/Plan:   Gross hematuria--new onset since heparin started.  Hgb 11.1 (12.8). May be related to possible XRT for PCa--no evidence of this longstanding.  Would recommend stopping heparin unless absolutely  necessary  Will order bladder irrigation, follow for now.   LOS: 2 days   Jorja Loa 04/08/2019, 6:44 AM

## 2019-04-09 DIAGNOSIS — I491 Atrial premature depolarization: Secondary | ICD-10-CM

## 2019-04-09 DIAGNOSIS — R001 Bradycardia, unspecified: Secondary | ICD-10-CM

## 2019-04-09 LAB — CBC
HCT: 29.5 % — ABNORMAL LOW (ref 39.0–52.0)
Hemoglobin: 10.1 g/dL — ABNORMAL LOW (ref 13.0–17.0)
MCH: 32.4 pg (ref 26.0–34.0)
MCHC: 34.2 g/dL (ref 30.0–36.0)
MCV: 94.6 fL (ref 80.0–100.0)
Platelets: 140 10*3/uL — ABNORMAL LOW (ref 150–400)
RBC: 3.12 MIL/uL — ABNORMAL LOW (ref 4.22–5.81)
RDW: 13.5 % (ref 11.5–15.5)
WBC: 5.7 10*3/uL (ref 4.0–10.5)
nRBC: 0 % (ref 0.0–0.2)

## 2019-04-09 LAB — BASIC METABOLIC PANEL
Anion gap: 6 (ref 5–15)
BUN: 42 mg/dL — ABNORMAL HIGH (ref 8–23)
CO2: 20 mmol/L — ABNORMAL LOW (ref 22–32)
Calcium: 7.6 mg/dL — ABNORMAL LOW (ref 8.9–10.3)
Chloride: 115 mmol/L — ABNORMAL HIGH (ref 98–111)
Creatinine, Ser: 1.75 mg/dL — ABNORMAL HIGH (ref 0.61–1.24)
GFR calc Af Amer: 37 mL/min — ABNORMAL LOW (ref >60)
GFR calc non Af Amer: 32 mL/min — ABNORMAL LOW (ref >60)
Glucose, Bld: 111 mg/dL — ABNORMAL HIGH (ref 70–99)
Potassium: 3.6 mmol/L (ref 3.5–5.1)
Sodium: 141 mmol/L (ref 135–145)

## 2019-04-09 NOTE — Progress Notes (Signed)
Subjective: Patient reports no pain.  Apparently, no catheter related issues over the past day.  Objective: Vital signs in last 24 hours: Temp:  [98 F (36.7 C)-98.7 F (37.1 C)] 98 F (36.7 C) (08/25 0551) Pulse Rate:  [62-66] 66 (08/25 0551) Resp:  [16-17] 17 (08/25 0551) BP: (115-118)/(49-70) 115/49 (08/25 0551) SpO2:  [95 %] 95 % (08/25 0551) Weight:  [65.9 kg] 65.9 kg (08/25 0551)  Intake/Output from previous day: 08/24 0701 - 08/25 0700 In: 2008 [P.O.:300; I.V.:1708] Out: 1900 [Urine:1900] Intake/Output this shift: No intake/output data recorded.  Physical Exam:  Constitutional: Vital signs reviewed. WD WN in NAD   Eyes: PERRL, No scleral icterus.   Cardiovascular: Regular rate Pulmonary/Chest: Normal effort Extremities: No cyanosis or edema   Urine still bloody, but subjectively less blood Lab Results: Recent Labs    04/08/19 0651 04/08/19 1035 04/09/19 0340  HGB 11.0* 11.3* 10.1*  HCT 32.1* 33.2* 29.5*   BMET Recent Labs    04/08/19 0651 04/09/19 0340  NA 142 141  K 3.8 3.6  CL 115* 115*  CO2 21* 20*  GLUCOSE 111* 111*  BUN 51* 42*  CREATININE 2.52* 1.75*  CALCIUM 7.7* 7.6*   No results for input(s): LABPT, INR in the last 72 hours. No results for input(s): LABURIN in the last 72 hours. Results for orders placed or performed during the hospital encounter of 04/05/19  SARS CORONAVIRUS 2 Nasal Swab Aptima Multi Swab     Status: None   Collection Time: 04/05/19  8:50 PM   Specimen: Aptima Multi Swab; Nasal Swab  Result Value Ref Range Status   SARS Coronavirus 2 NEGATIVE NEGATIVE Final    Comment: (NOTE) SARS-CoV-2 target nucleic acids are NOT DETECTED. The SARS-CoV-2 RNA is generally detectable in upper and lower respiratory specimens during the acute phase of infection. Negative results do not preclude SARS-CoV-2 infection, do not rule out co-infections with other pathogens, and should not be used as the sole basis for treatment or other  patient management decisions. Negative results must be combined with clinical observations, patient history, and epidemiological information. The expected result is Negative. Fact Sheet for Patients: SugarRoll.be Fact Sheet for Healthcare Providers: https://www.woods-mathews.com/ This test is not yet approved or cleared by the [place] FDA and  has been authorized for detection and/or diagnosis of SARS-CoV-2 by FDA under an Emergency Use Authorization (EUA). This EUA will remain  in effect (meaning this test can be used) for the duration of the COVID-19 declaration under Section 56 4(b)(1) of the Act, 21 U.S.C. section 360bbb-3(b)(1), unless the authorization is terminated or revoked sooner. Performed at [facility], [place] [address] [place], [place] [address]     Studies/Results: US Renal  Result Date: 04/07/2019 CLINICAL DATA:  Acute kidney injury. EXAM: RENAL / URINARY TRACT ULTRASOUND COMPLETE COMPARISON:  None. FINDINGS: Right Kidney: Renal measurements: 10.2 x 5.2 x 5.0 cm = volume: 140 mL. Cortical atrophy present. No hydronephrosis at the level of the kidney. The renal pelvis and proximal ureter may potentially be dilated. Left Kidney: Renal measurements: 8.9 x 7.7 x 4.0 cm = volume: 147 mL. Echogenic cortex with loss of corticomedullary differentiation. Interpolar exophytic simple cyst measures 3 cm. No hydronephrosis. Bladder: Moderately distended bladder demonstrates trabeculated wall with potentially a small amount of intraluminal debris. IMPRESSION: 1. Cortical atrophy of right kidney with potential component of dilatation of the renal pelvis and proximal ureter. 2. Echogenic left kidney with 3 cm interpolar simple cyst. 3. Moderately distended trabeculated  bladder with potential small amount of intraluminal debris. Electronically Signed   By: Aletta Edouard M.D.   On: 04/07/2019 14:12    Assessment/Plan:   Continued  hematuria--prob related to h/o PCA (?as to whether he had XRT or surgical mgmt)  Agree w/ holding heparin--would expect urine to clear now, hopefully quickly   LOS: 3 days   Jorja Loa 04/09/2019, 7:09 AM

## 2019-04-09 NOTE — Care Management Important Message (Signed)
Important Message  Patient Details  Name: Corey Small MRN: [id number] Date of Birth: [birth year]   Medicare Important Message Given:  Yes     Shelda Altes 04/09/2019, 11:02 AM

## 2019-04-09 NOTE — Progress Notes (Signed)
PROGRESS NOTE    Corey Small  TDV:[id number] DOB: 03-25-1920 DOA: 04/05/2019 PCP: Lorene Dy, MD    Brief Narrative:  85 year old male who presented with generalized weakness and dizziness.  He does have significant past medical history for hypertension, dyslipidemia prostate cancer, ckd stage IV.  Patient reported feeling weak and dizzy for about 4 days, worsening symptoms while standing, no chest pain or dyspnea.  EMS was called to his home, he was found hypotensive and received 500 cc of IV normal saline.  In the emergency department his blood pressure was 109/68, heart rate 80, respiratory rate 15, temperature 98.7, oxygen saturation 91% his lungs are clear to auscultation bilaterally, heart S1-S2 present, abdomen soft nontender, no lower extremity edema. Sodium 141, potassium 5.2, chloride 110, bicarb 20, glucose 124, BUN 68, creatinine 3.34, magnesium 2.2, troponin 39, white count 8.5, hemoglobin 14.9, hematocrit 44.4, platelets 182.  SARS COVID-19 was negative.  His urinalysis had 6-10 white cells, more than 50 red cells.  Chest x-ray had hyperinflation, bibasilar atelectasis, no infiltrates.  EKG 104 bpm, left axis deviation, atrial flutter 3-1 block, J-point elevation V2 to V3, no T wave changes.  Poor R wave progression.  Patient was admitted to the hospital working diagnosis of acute kidney injury complicated by new onset atrial flutter.   Assessment & Plan:   Principal Problem:   Atrial flutter ([place]) Active Problems:   AKI (acute kidney injury) ([place])   Essential hypertension   ARF (acute renal failure) ([facility])   Pressure injury of skin    1. AKI on CKD stage IV. Renal function improved with serum cr today is 1,72 with urine output is 1,900 ml over last 24 H.  Discontinue isotonic saline for now, patient is tolerating po well.  2. New onset atrial flutter. Paroxysmal. Patient continue to be asymptomatic, positive sinus bradycardia in the low 60's and high 50's the  mayority of the time, telemetry with PAC and PVC, plus positive pauses max of 1,6 seconds,  personally reviewed telemetry. Plan to continue telemetry monitoring, out of bed and physical therapy evaluation. Continue to hold on AV blockers due to risk of worsening bradycardia. No anticoagulation due to hematuria.   3. Hematuria with acute blood loss anemia.  Foley catheter in place, continue holding anticoagulation. Cell count continue to be stable. Will continue to follow with urology recommendations.   4. HTN. Stable blood pressure with systolic 062 to 694 mmHg. Holding blood pressure agents.   5. Stage 2 pressure ulcer sacrum, present on admission. Will continue with local wound care.   DVT prophylaxis: enoxaparin   Code Status:  dnr  Family Communication:  no family at the bedside    Body mass index is 20.27 kg/m. Malnutrition Type:      Malnutrition Characteristics:      Nutrition Interventions:     RN Pressure Injury Documentation: Pressure Injury 04/06/19 Sacrum Stage II -  Partial thickness loss of dermis presenting as a shallow open ulcer with a red, pink wound bed without slough. (Active)  04/06/19 0800  Location: Sacrum  Location Orientation:   Staging: Stage II -  Partial thickness loss of dermis presenting as a shallow open ulcer with a red, pink wound bed without slough.  Wound Description (Comments):   Present on Admission: Yes     Consultants:   Cardiology   Urology  Procedures:     Antimicrobials:       Subjective: Patient feeling better but continue to be very weak and  deconditioned, no nausea or vomiting, no chest pain or dyspnea. Has not been out of bed yet.   Objective: Vitals:   04/07/19 2118 04/08/19 0521 04/08/19 2126 04/09/19 0551  BP:  114/61 118/70 (!) 115/49  Pulse: 89 78 62 66  Resp:   16 17  Temp: 98.6 F (37 C) 98.2 F (36.8 C) 98.7 F (37.1 C) 98 F (36.7 C)  TempSrc: Oral Oral Oral Oral  SpO2: 96% 98% 95% 95%   Weight:  69.4 kg  65.9 kg  Height:        Intake/Output Summary (Last 24 hours) at 04/09/2019 1059 Last data filed at 04/09/2019 0900 Gross per 24 hour  Intake 2067.99 ml  Output 1900 ml  Net 167.99 ml   Filed Weights   04/06/19 0358 04/08/19 0521 04/09/19 0551  Weight: 65.8 kg 69.4 kg 65.9 kg    Examination:   General: Not in pain or dyspnea, deconditioned  Neurology: Awake and alert, non focal  E ENT: mild pallor, no icterus, oral mucosa moist Cardiovascular: No JVD. S1-S2 present, rhythmic, no gallops, rubs, or murmurs. No lower extremity edema. Pulmonary: positive breath sounds bilaterally, adequate air movement, no wheezing, rhonchi or rales. Gastrointestinal. Abdomen with no organomegaly, non tender, no rebound or guarding Skin. No rashes Musculoskeletal: no joint deformities     Data Reviewed: I have personally reviewed following labs and imaging studies  CBC: Recent Labs  Lab 04/05/19 1802 04/06/19 0617 04/07/19 0318 04/07/19 1818 04/08/19 0018 04/08/19 0651 04/08/19 1035 04/09/19 0340  WBC 8.5 8.5 12.5*  --   --  7.4 7.1 5.7  NEUTROABS 6.1 6.7  --   --   --   --   --   --   HGB 14.9 14.4 13.7 12.8* 11.1* 11.0* 11.3* 10.1*  HCT 44.4 42.6 40.8 37.6* 33.0* 32.1* 33.2* 29.5*  MCV 96.5 95.1 95.3  --   --  95.3 96.2 94.6  PLT 182 185 178  --   --  155 158 371*   Basic Metabolic Panel: Recent Labs  Lab 04/05/19 1802 04/06/19 0617 04/07/19 0318 04/08/19 0651 04/09/19 0340  NA 141 139 140 142 141  K 5.2* 4.0 4.5 3.8 3.6  CL 110 108 111 115* 115*  CO2 20* 21* 19* 21* 20*  GLUCOSE 124* 117* 125* 111* 111*  BUN 68* 64* 68* 51* 42*  CREATININE 3.34* 3.05* 4.69* 2.52* 1.75*  CALCIUM 8.5* 8.4* 8.3* 7.7* 7.6*  MG 2.2  --   --   --   --    GFR: Estimated Creatinine Clearance: 23 mL/min (A) (by C-G formula based on SCr of 1.75 mg/dL (H)). Liver Function Tests: Recent Labs  Lab 04/06/19 0617  AST 19  ALT 16  ALKPHOS 67  BILITOT 2.0*  PROT 5.5*   ALBUMIN 3.3*   No results for input(s): LIPASE, AMYLASE in the last 168 hours. No results for input(s): AMMONIA in the last 168 hours. Coagulation Profile: No results for input(s): INR, PROTIME in the last 168 hours. Cardiac Enzymes: No results for input(s): CKTOTAL, CKMB, CKMBINDEX, TROPONINI in the last 168 hours. BNP (last 3 results) No results for input(s): PROBNP in the last 8760 hours. HbA1C: No results for input(s): HGBA1C in the last 72 hours. CBG: Recent Labs  Lab 04/06/19 2121  GLUCAP 122*   Lipid Profile: No results for input(s): CHOL, HDL, LDLCALC, TRIG, CHOLHDL, LDLDIRECT in the last 72 hours. Thyroid Function Tests: No results for input(s): TSH, T4TOTAL, FREET4, T3FREE, THYROIDAB in  the last 72 hours. Anemia Panel: No results for input(s): VITAMINB12, FOLATE, FERRITIN, TIBC, IRON, RETICCTPCT in the last 72 hours.    Radiology Studies: I have reviewed all of the imaging during this hospital visit personally     Scheduled Meds: . dutasteride  0.5 mg Oral q morning - 10a   Continuous Infusions:   LOS: 3 days        Laylee Schooley Gerome Apley, MD

## 2019-04-09 NOTE — Evaluation (Signed)
Physical Therapy Evaluation Patient Details Name: Corey Small MRN: [id number] DOB: [birth year] Today's Date: 04/09/2019   History of Present Illness  85 y.o. male with history of hypertension hyperlipidemia prostate cancer was brought to the ER after patient has been feeling weak and dizzy over the last 4 days. EKG in ED shows A flutter. Admitted 8/21 for treatment of A flutter ,hypotension ARF, and AKI.  Clinical Impression  PTA pt resident of [facility]. Pt and son report independence with ambulation using RW, independent with ADLs. Facility provides meals. Pt reports increasing weakness and at least one fall over week before admission. Pt is limited in safe mobility by decreased strength and balance, with decreased ability to notify staff of his deficits. PT recommending discharge to SNF level rehab at [facility] prior to return to his independent living apartment. PT will continue to follow acutely until d/c.     Follow Up Recommendations SNF    Equipment Recommendations  None recommended by PT    Recommendations for Other Services       Precautions / Restrictions Precautions Precautions: Fall Precaution Comments: fell prior to admission  Restrictions Weight Bearing Restrictions: No      Mobility  Bed Mobility Overal bed mobility: Needs Assistance Bed Mobility: Supine to Sit     Supine to sit: Mod assist     General bed mobility comments: modA for management of LE off bed and pt to pull against therapist to bring trunk to upright, pt attempted to scoot hips to EoB ultimately requiring pad scoot for feet to touch floor  Transfers Overall transfer level: Needs assistance Equipment used: Rolling walker (2 wheeled) Transfers: Sit to/from Stand Sit to Stand: Mod assist;From elevated surface         General transfer comment: mod A for power up, reliant on side of bed to steady prior to stepping away from bed    Ambulation/Gait Ambulation/Gait assistance: Mod assist Gait Distance (Feet): 8 Feet Assistive device: Rolling walker (2 wheeled) Gait Pattern/deviations: Step-to pattern;Decreased step length - right;Decreased step length - left;Trunk flexed;Shuffle;Narrow base of support Gait velocity: slowed Gait velocity interpretation: <1.31 ft/sec, indicative of household ambulator General Gait Details: modA for steadying with shuffling gait, decreased foot clearance, vc for proximity to RW         Balance Overall balance assessment: Needs assistance Sitting-balance support: Feet supported;No upper extremity supported Sitting balance-Leahy Scale: Fair     Standing balance support: Bilateral upper extremity supported Standing balance-Leahy Scale: Poor Standing balance comment: requires UE support                              Pertinent Vitals/Pain Pain Assessment: Faces Faces Pain Scale: Hurts a little bit Pain Location: generalized with movement Pain Descriptors / Indicators: Grimacing Pain Intervention(s): Limited activity within patient's tolerance;Monitored during session;Repositioned    Home Living Family/patient expects to be discharged to:: Assisted living               Home Equipment: Walker - 2 wheels;Cane - single point      Prior Function Level of Independence: Needs assistance   Gait / Transfers Assistance Needed: ambulates with cane or RW  ADL's / Homemaking Assistance Needed: independent in ADLs, facitily provides for iADLs  Comments: pt reports not being able to get to meals for 3-4 days due to weakness     Hand Dominance        Extremity/Trunk  Assessment   Upper Extremity Assessment Upper Extremity Assessment: Generalized weakness    Lower Extremity Assessment Lower Extremity Assessment: Generalized weakness       Communication   Communication: HOH  Cognition Arousal/Alertness: Awake/alert Behavior During Therapy: WFL for tasks  assessed/performed Overall Cognitive Status: Within Functional Limits for tasks assessed                                        General Comments General comments (skin integrity, edema, etc.): VSS, son in room providing information on PLOF        Assessment/Plan    PT Assessment Patient needs continued PT services  PT Problem List Decreased strength;Decreased activity tolerance;Decreased balance;Decreased mobility;Decreased coordination;Decreased safety awareness;Decreased knowledge of use of DME       PT Treatment Interventions DME instruction;Gait training;Functional mobility training;Therapeutic activities;Therapeutic exercise;Balance training;Cognitive remediation;Patient/family education    PT Goals (Current goals can be found in the Care Plan section)  Acute Rehab PT Goals Patient Stated Goal: feel better PT Goal Formulation: With patient/family Time For Goal Achievement: 04/23/19 Potential to Achieve Goals: Fair    Frequency Min 2X/week    AM-PAC PT "6 Clicks" Mobility  Outcome Measure Help needed turning from your back to your side while in a flat bed without using bedrails?: A Little Help needed moving from lying on your back to sitting on the side of a flat bed without using bedrails?: A Lot Help needed moving to and from a bed to a chair (including a wheelchair)?: A Lot Help needed standing up from a chair using your arms (e.g., wheelchair or bedside chair)?: A Lot Help needed to walk in hospital room?: A Lot Help needed climbing 3-5 steps with a railing? : Total 6 Click Score: 12    End of Session Equipment Utilized During Treatment: Gait belt Activity Tolerance: Patient tolerated treatment well Patient left: in chair;with call bell/phone within reach;with chair alarm set;with family/visitor present Nurse Communication: Mobility status PT Visit Diagnosis: Unsteadiness on feet (R26.81);Other abnormalities of gait and mobility (R26.89);History of  falling (Z91.81);Muscle weakness (generalized) (M62.81);Difficulty in walking, not elsewhere classified (R26.2);Adult, failure to thrive (R62.7)    Time: 6812-7517 PT Time Calculation (min) (ACUTE ONLY): 21 min   Charges:   PT Evaluation $PT Eval Moderate Complexity: 1 Mod          Blu Mcglaun B. Migdalia Dk PT, DPT Acute Rehabilitation Services Pager [phone] Office [phone]   [place] 04/09/2019, 4:20 PM

## 2019-04-09 NOTE — Progress Notes (Signed)
[facility] ROUNDING NOTE   Subjective:   This is a very pleasant 85 year old gentleman with a history of renal insufficiency appears that his creatinine was 3.3 his last creatinine 2015 was 2.0.  He does have a history of prostate cancer.  He was admitted 04/05/2019 with dizziness and near syncope.  He has a history of atrial flutter and a blood pressure that was 90/60 on admission is responded to fluid bolus  Blood pressure 150/49 pulse 66 temperature 98 O2 sats 95% room air  Urine output 2.2 L 04/08/2019  Sodium 141 potassium 3.6 chloride 115 CO2 20 BUN 42 creatinine 1.75 glucose 111 calcium 7.6 WBC 5.7 hemoglobin 10.1 and platelets 140  Renal ultrasound showed cortical atrophy of the right kidney echogenic left kidney with a 3 cm interpolar simple cyst and a moderately distended trabeculated bladder   Avodart 0.5 mg daily  .  Objective:  Vital signs in last 24 hours:  Temp:  [98 F (36.7 C)-98.7 F (37.1 C)] 98 F (36.7 C) (08/25 0551) Pulse Rate:  [62-66] 66 (08/25 0551) Resp:  [16-17] 17 (08/25 0551) BP: (115-118)/(49-70) 115/49 (08/25 0551) SpO2:  [95 %] 95 % (08/25 0551) Weight:  [65.9 kg] 65.9 kg (08/25 0551)  Weight change: -3.538 kg Filed Weights   04/06/19 0358 04/08/19 0521 04/09/19 0551  Weight: 65.8 kg 69.4 kg 65.9 kg    Intake/Output: I/O last 3 completed shifts: In: 3113.7 [P.O.:300; I.V.:2813.7] Out: 3500 [Urine:3500]   Intake/Output this shift:  No intake/output data recorded.  Gen awake, no distress, elderly WM No rash, cyanosis or gangrene Sclera anicteric, throat clear  No jvd or bruits Chest clear bilat to bases, no rales or wheezing RRR no MRG Abd soft ntnd no mass or ascites +bs GU normal male MS no joint effusions or deformity Ext no LE or UE edema, no wounds or ulcers Neuro is alert, Ox 3 , nf, no asterixis   Basic Metabolic Panel: Recent Labs  Lab 04/05/19 1802 04/06/19 0617 04/07/19 0318 04/08/19 0651  04/09/19 0340  NA 141 139 140 142 141  K 5.2* 4.0 4.5 3.8 3.6  CL 110 108 111 115* 115*  CO2 20* 21* 19* 21* 20*  GLUCOSE 124* 117* 125* 111* 111*  BUN 68* 64* 68* 51* 42*  CREATININE 3.34* 3.05* 4.69* 2.52* 1.75*  CALCIUM 8.5* 8.4* 8.3* 7.7* 7.6*  MG 2.2  --   --   --   --     Liver Function Tests: Recent Labs  Lab 04/06/19 0617  AST 19  ALT 16  ALKPHOS 67  BILITOT 2.0*  PROT 5.5*  ALBUMIN 3.3*   No results for input(s): LIPASE, AMYLASE in the last 168 hours. No results for input(s): AMMONIA in the last 168 hours.  CBC: Recent Labs  Lab 04/05/19 1802 04/06/19 0617 04/07/19 0318 04/07/19 1818 04/08/19 0018 04/08/19 0651 04/08/19 1035 04/09/19 0340  WBC 8.5 8.5 12.5*  --   --  7.4 7.1 5.7  NEUTROABS 6.1 6.7  --   --   --   --   --   --   HGB 14.9 14.4 13.7 12.8* 11.1* 11.0* 11.3* 10.1*  HCT 44.4 42.6 40.8 37.6* 33.0* 32.1* 33.2* 29.5*  MCV 96.5 95.1 95.3  --   --  95.3 96.2 94.6  PLT 182 185 178  --   --  155 158 140*    Cardiac Enzymes: No results for input(s): CKTOTAL, CKMB, CKMBINDEX, TROPONINI in the last 168 hours.  BNP:  Invalid input(s): POCBNP  CBG: Recent Labs  Lab 04/06/19 2121  GLUCAP 62*    Microbiology: Results for orders placed or performed during the hospital encounter of 04/05/19  SARS CORONAVIRUS 2 Nasal Swab Aptima Multi Swab     Status: None   Collection Time: 04/05/19  8:50 PM   Specimen: Aptima Multi Swab; Nasal Swab  Result Value Ref Range Status   SARS Coronavirus 2 NEGATIVE NEGATIVE Final    Comment: (NOTE) SARS-CoV-2 target nucleic acids are NOT DETECTED. The SARS-CoV-2 RNA is generally detectable in upper and lower respiratory specimens during the acute phase of infection. Negative results do not preclude SARS-CoV-2 infection, do not rule out co-infections with other pathogens, and should not be used as the sole basis for treatment or other patient management decisions. Negative results must be combined with clinical  observations, patient history, and epidemiological information. The expected result is Negative. Fact Sheet for Patients: SugarRoll.be Fact Sheet for Healthcare Providers: https://www.woods-mathews.com/ This test is not yet approved or cleared by the [place] FDA and  has been authorized for detection and/or diagnosis of SARS-CoV-2 by FDA under an Emergency Use Authorization (EUA). This EUA will remain  in effect (meaning this test can be used) for the duration of the COVID-19 declaration under Section 56 4(b)(1) of the Act, 21 U.S.C. section 360bbb-3(b)(1), unless the authorization is terminated or revoked sooner. Performed at [facility], [place] [address], [place], [place] [address]     Coagulation Studies: No results for input(s): LABPROT, INR in the last 72 hours.  Urinalysis: Recent Labs    04/07/19 2024  COLORURINE RED*  LABSPEC TEST NOT REPORTED DUE TO COLOR INTERFERENCE OF URINE PIGMENT  PHURINE TEST NOT REPORTED DUE TO COLOR INTERFERENCE OF URINE PIGMENT  GLUCOSEU TEST NOT REPORTED DUE TO COLOR INTERFERENCE OF URINE PIGMENT*  HGBUR TEST NOT REPORTED DUE TO COLOR INTERFERENCE OF URINE PIGMENT*  BILIRUBINUR TEST NOT REPORTED DUE TO COLOR INTERFERENCE OF URINE PIGMENT*  KETONESUR TEST NOT REPORTED DUE TO COLOR INTERFERENCE OF URINE PIGMENT*  PROTEINUR TEST NOT REPORTED DUE TO COLOR INTERFERENCE OF URINE PIGMENT*  NITRITE TEST NOT REPORTED DUE TO COLOR INTERFERENCE OF URINE PIGMENT*  LEUKOCYTESUR TEST NOT REPORTED DUE TO COLOR INTERFERENCE OF URINE PIGMENT*      Imaging: US Renal  Result Date: 04/07/2019 CLINICAL DATA:  Acute kidney injury. EXAM: RENAL / URINARY TRACT ULTRASOUND COMPLETE COMPARISON:  None. FINDINGS: Right Kidney: Renal measurements: 10.2 x 5.2 x 5.0 cm = volume: 140 mL. Cortical atrophy present. No hydronephrosis at the level of the kidney. The renal pelvis and proximal ureter may potentially be  dilated. Left Kidney: Renal measurements: 8.9 x 7.7 x 4.0 cm = volume: 147 mL. Echogenic cortex with loss of corticomedullary differentiation. Interpolar exophytic simple cyst measures 3 cm. No hydronephrosis. Bladder: Moderately distended bladder demonstrates trabeculated wall with potentially a small amount of intraluminal debris. IMPRESSION: 1. Cortical atrophy of right kidney with potential component of dilatation of the renal pelvis and proximal ureter. 2. Echogenic left kidney with 3 cm interpolar simple cyst. 3. Moderately distended trabeculated bladder with potential small amount of intraluminal debris. Electronically Signed   By: Aletta Edouard M.D.   On: 04/07/2019 14:12     Medications:   . sodium chloride 75 mL/hr at 04/09/19 0602   . dutasteride  0.5 mg Oral q morning - 10a   acetaminophen **OR** acetaminophen, ondansetron **OR** ondansetron (ZOFRAN) IV  Assessment/ Plan:   Acute kidney injury on baseline of  chronic kidney disease baseline serum creatinine which appears to be at 2.0 mg/dL.  AKI is most likely the use of ACE inhibitor hypotension dehydration ultrasound did not show any evidence of hydronephrosis but he did have a trabeculated bladder.  With rehydration and stopping his ACE inhibitor medications it appears that his creatinine appears to have improved.    Hypertension/volume appears to be stable we will continue hydration at 75 cc an hour and withholding any antihypertensive medications.  May discontinue IV fluids at this point.  Anemia stable not issue at this time  Hypotension secondary to medications and volume depletion.   Will sign off on this gentleman.  I do not believe he needs nephrology follow-up   LOS: [place] @TODAY @8 :55 AM

## 2019-04-09 NOTE — Progress Notes (Addendum)
Progress Note  Patient Name: Corey Small Date of Encounter: 04/09/2019  Primary Cardiologist: Skeet Latch, MD   Subjective   No chest pain or SOB, no lightheadedness was up in chair once yesterday  Inpatient Medications    Scheduled Meds: . dutasteride  0.5 mg Oral q morning - 10a   Continuous Infusions: . sodium chloride 75 mL/hr at 04/09/19 0602   PRN Meds: acetaminophen **OR** acetaminophen, ondansetron **OR** ondansetron (ZOFRAN) IV   Vital Signs    Vitals:   04/07/19 2118 04/08/19 0521 04/08/19 2126 04/09/19 0551  BP:  114/61 118/70 (!) 115/49  Pulse: 89 78 62 66  Resp:   16 17  Temp: 98.6 F (37 C) 98.2 F (36.8 C) 98.7 F (37.1 C) 98 F (36.7 C)  TempSrc: Oral Oral Oral Oral  SpO2: 96% 98% 95% 95%  Weight:  69.4 kg  65.9 kg  Height:        Intake/Output Summary (Last 24 hours) at 04/09/2019 0755 Last data filed at 04/09/2019 0602 Gross per 24 hour  Intake 2007.99 ml  Output 1900 ml  Net 107.99 ml   Last 3 Weights 04/09/2019 04/08/2019 04/06/2019  Weight (lbs) 145 lb 4.8 oz 153 lb 1.6 oz 145 lb  Weight (kg) 65.908 kg 69.446 kg 65.772 kg      Telemetry    SR SB with PACs - Personally Reviewed  ECG    No new - Personally Reviewed  Physical Exam   GEN: No acute distress.   Neck: No JVD Cardiac: RRR, no murmurs, rubs, or gallops.  Respiratory: Clear to auscultation bilaterally. GI: Soft, nontender, non-distended  MS: No edema; No deformity. Neuro:  Nonfocal  Psych: Normal affect  GU urine less red in tubing  Labs    High Sensitivity Troponin:   Recent Labs  Lab 04/05/19 1802 04/05/19 2228  TROPONINIHS 39* 44*      Chemistry Recent Labs  Lab 04/06/19 0617 04/07/19 0318 04/08/19 0651 04/09/19 0340  NA 139 140 142 141  K 4.0 4.5 3.8 3.6  CL 108 111 115* 115*  CO2 21* 19* 21* 20*  GLUCOSE 117* 125* 111* 111*  BUN 64* 68* 51* 42*  CREATININE 3.05* 4.69* 2.52* 1.75*  CALCIUM 8.4* 8.3* 7.7* 7.6*  PROT 5.5*  --   --    --   ALBUMIN 3.3*  --   --   --   AST 19  --   --   --   ALT 16  --   --   --   ALKPHOS 67  --   --   --   BILITOT 2.0*  --   --   --   GFRNONAA 16* 10* 21* 32*  GFRAA 19* 11* 24* 37*  ANIONGAP 10 10 6 6      Hematology Recent Labs  Lab 04/08/19 0651 04/08/19 1035 04/09/19 0340  WBC 7.4 7.1 5.7  RBC 3.37* 3.45* 3.12*  HGB 11.0* 11.3* 10.1*  HCT 32.1* 33.2* 29.5*  MCV 95.3 96.2 94.6  MCH 32.6 32.8 32.4  MCHC 34.3 34.0 34.2  RDW 13.5 13.6 13.5  PLT 155 158 140*    BNP Recent Labs  Lab 04/05/19 1803  BNP 65.4     DDimer No results for input(s): DDIMER in the last 168 hours.   Radiology    US Renal  Result Date: 04/07/2019 CLINICAL DATA:  Acute kidney injury. EXAM: RENAL / URINARY TRACT ULTRASOUND COMPLETE COMPARISON:  None. FINDINGS: Right Kidney: Renal measurements:  10.2 x 5.2 x 5.0 cm = volume: 140 mL. Cortical atrophy present. No hydronephrosis at the level of the kidney. The renal pelvis and proximal ureter may potentially be dilated. Left Kidney: Renal measurements: 8.9 x 7.7 x 4.0 cm = volume: 147 mL. Echogenic cortex with loss of corticomedullary differentiation. Interpolar exophytic simple cyst measures 3 cm. No hydronephrosis. Bladder: Moderately distended bladder demonstrates trabeculated wall with potentially a small amount of intraluminal debris. IMPRESSION: 1. Cortical atrophy of right kidney with potential component of dilatation of the renal pelvis and proximal ureter. 2. Echogenic left kidney with 3 cm interpolar simple cyst. 3. Moderately distended trabeculated bladder with potential small amount of intraluminal debris. Electronically Signed   By: Aletta Edouard M.D.   On: 04/07/2019 14:12    Cardiac Studies   Echo 04/06/19  IMPRESSIONS   1. The left ventricle has normal systolic function with an ejection fraction of 60-65%. The cavity size was normal. Left ventricular diastolic function could not be evaluated secondary to atrial fibrillation. 2.  The right ventricle has normal systolic function. The cavity was normal. There is no increase in right ventricular wall thickness. 3. The aortic valve is abnormal. Mild thickening of the aortic valve. Mild calcification of the aortic valve. Aortic valve regurgitation is trivial by color flow Doppler. 4. The aorta is normal unless otherwise noted. 5. The aortic root and ascending aorta are normal in size and structure. 6. The atrial septum is grossly normal.  FINDINGS Left Ventricle: The left ventricle has normal systolic function, with an ejection fraction of 60-65%. The cavity size was normal. There is no increase in left ventricular wall thickness. Left ventricular diastolic function could not be evaluated  secondary to atrial fibrillation. Definity contrast agent was given IV to delineate the left ventricular endocardial borders.  Right Ventricle: The right ventricle has normal systolic function. The cavity was normal. There is no increase in right ventricular wall thickness.  Left Atrium: Left atrial size was normal in size.  Right Atrium: Right atrial size was normal in size. Right atrial pressure is estimated at 8 mmHg.  Interatrial Septum: The atrial septum is grossly normal.  Pericardium: There is no evidence of pericardial effusion.  Mitral Valve: The mitral valve is normal in structure. Mitral valve regurgitation is trivial by color flow Doppler.  Tricuspid Valve: The tricuspid valve is normal in structure. Tricuspid valve regurgitation was not visualized by color flow Doppler.  Aortic Valve: The aortic valve is abnormal Mild thickening of the aortic valve. Mild calcification of the aortic valve. Aortic valve regurgitation is trivial by color flow Doppler.  Pulmonic Valve: The pulmonic valve was grossly normal. Pulmonic valve regurgitation is not visualized by color flow Doppler.  Aorta: The aortic root and ascending aorta are normal in size and structure. The  aorta is normal unless otherwise noted.   Patient Profile     85 y.o. male with a hx of hypertension, hyperlipidemia, prostate cancerand admitted with weakness and dizziness.  Found to be in a flutter and AKI due to dehydration- he was hydrated and placed on coumadin now stopped due to significant hematuria. .  Assessment & Plan    Typical a flutter and placed on IV heparin and Coumadin with significant hematuria IV heparin to be  stopped and coumadin has not been started, only on ASA.  Now in SR  Did have HR to 30  And one episode of 6 beats of SVT. some slow rate due to nonconducted PACs.  On  no rate slowing meds.  TSH 1.278   Sinus Bradycardia. To 30 on no meds.  BP stable 485 systolic  Asymptomatic in bed. And in chair.  Will hold EP for now.   AKI with dehydration given IV fluids seen by Renal, baseline Cr maybe 2.0, ACEi held, Cr pk of 4.69 now 1.75   Hematuria has been seen by GU , heparin stopped, foley placed. Hgb down from 13.7 to 10.  off heparin now   Hx prostate cancer.   Per GU     For questions or updates, please contact [place] Please consult [email or web address] for contact info under        Signed, Cecilie Kicks, NP  04/09/2019, 7:55 AM

## 2019-04-10 LAB — BASIC METABOLIC PANEL
Anion gap: 6 (ref 5–15)
BUN: 32 mg/dL — ABNORMAL HIGH (ref 8–23)
CO2: 22 mmol/L (ref 22–32)
Calcium: 7.7 mg/dL — ABNORMAL LOW (ref 8.9–10.3)
Chloride: 113 mmol/L — ABNORMAL HIGH (ref 98–111)
Creatinine, Ser: 1.43 mg/dL — ABNORMAL HIGH (ref 0.61–1.24)
GFR calc Af Amer: 48 mL/min — ABNORMAL LOW (ref >60)
GFR calc non Af Amer: 41 mL/min — ABNORMAL LOW (ref >60)
Glucose, Bld: 107 mg/dL — ABNORMAL HIGH (ref 70–99)
Potassium: 3.9 mmol/L (ref 3.5–5.1)
Sodium: 141 mmol/L (ref 135–145)

## 2019-04-10 NOTE — TOC Initial Note (Signed)
Transition of Care [facility]) - Initial/Assessment Note    Patient Details  Name: Corey Small MRN: [id number] Date of Birth: 1919-12-23  Transition of Care [facility]) CM/SW Contact:    Eileen Stanford, LCSW Phone Number: 04/10/2019, 11:30 AM  Clinical Narrative:   Pt is only alert to self and place. CSW spoke with pt's son via telephone. Pt's son confirmed pt is from [facility]. Pt's son is agreeable to pt d/c to [facility]. Referral has been sent. Admission coordinator to review. Anticipated transfer today.                 Expected Discharge Plan: Skilled Nursing Facility Barriers to Discharge: No Barriers Identified   Patient Goals and CMS Choice Patient states their goals for this hospitalization and ongoing recovery are:: " to get back to ALF"- son   Choice offered to / list presented to : NA  Expected Discharge Plan and Services Expected Discharge Plan: [place] In-house Referral: NA   Post Acute Care Choice: [place] Living arrangements for the past 2 months: [place] Expected Discharge Date: 04/10/19                         [facility] Arranged: NA          Prior Living Arrangements/Services Living arrangements for the past 2 months: [place] Lives with:: Self Patient language and need for interpreter reviewed:: Yes Do you feel safe going back to the place where you live?: Yes      Need for Family Participation in Patient Care: Yes (Comment) Care giver support system in place?: Yes (comment)   Criminal Activity/Legal Involvement Pertinent to Current Situation/Hospitalization: No - Comment as needed  Activities of Daily Living Home Assistive Devices/Equipment: None ADL Screening (condition at time of admission) Patient's cognitive ability adequate to safely complete daily activities?: Yes Is the patient deaf or have difficulty hearing?: Yes Does the patient have difficulty seeing, even when  wearing glasses/contacts?: No Does the patient have difficulty concentrating, remembering, or making decisions?: No Patient able to express need for assistance with ADLs?: Yes Does the patient have difficulty dressing or bathing?: Yes Independently performs ADLs?: Yes (appropriate for developmental age) Does the patient have difficulty walking or climbing stairs?: Yes Weakness of Legs: Both Weakness of Arms/Hands: Both  Permission Sought/Granted Permission sought to share information with : Family Supports, Customer service manager    Share Information with NAME: Kipp Brood  Permission granted to share info w AGENCY: [facility]  Permission granted to share info w Relationship: Son     Emotional Assessment Appearance:: Appears stated age Attitude/Demeanor/Rapport: Unable to Assess Affect (typically observed): Unable to Assess Orientation: : Oriented to Self, Oriented to Place Alcohol / Substance Use: Not Applicable Psych Involvement: No (comment)  Admission diagnosis:  AKI (acute kidney injury) ([place]) [N17.9] Atrial flutter, unspecified type [facility]) [I48.92] Patient Active Problem List   Diagnosis Date Noted  . Pressure injury of skin 04/07/2019  . AKI (acute kidney injury) ([place]) 04/06/2019  . Essential hypertension 04/06/2019  . ARF (acute renal failure) ([place]) 04/06/2019  . Atrial flutter ([place]) 04/05/2019  . DIVERTICULOSIS OF COLON 10/06/2009  . COLONIC POLYPS, ADENOMATOUS, HX OF 10/06/2009  . GASTROINTESTINAL HEMORRHAGE, HX OF 10/06/2009   PCP:  Lorene Dy, MD Pharmacy:   [facility] [place] - [address] [place] [address] Phone: [phone] Fax: [phone]  Social Determinants of Health (SDOH) Interventions    Readmission Risk Interventions No flowsheet data found.

## 2019-04-10 NOTE — Plan of Care (Signed)

## 2019-04-10 NOTE — TOC Transition Note (Signed)
Transition of Care [facility]) - CM/SW Discharge Note   Patient Details  Name: Corey Small MRN: [id number] Date of Birth: November 05, 1919  Transition of Care [facility]) CM/SW Contact:  Eileen Stanford, LCSW Phone Number: 04/10/2019, 1:31 PM   Clinical Narrative:   Clinical Social Worker facilitated patient discharge including contacting patient family and facility to confirm patient discharge plans.  Clinical information faxed to facility and family agreeable with plan.  CSW arranged ambulance transport via [facility] to [place] [facility] (SNF).  RN to call [phone] (extension [phone]) for report prior to discharge.   Final next level of care: Skilled Nursing Facility Barriers to Discharge: No Barriers Identified   Patient Goals and CMS Choice Patient states their goals for this hospitalization and ongoing recovery are:: " to get back to ALF"- son   Choice offered to / list presented to : NA  Discharge Placement              Patient chooses bed at: [place] [facility] Patient to be transferred to facility by: [facility] Name of family member notified: Kipp Brood Patient and family notified of of transfer: 04/10/19  Discharge Plan and Services In-house Referral: NA   Post Acute Care Choice: Skilled Nursing Facility                    HH Arranged: NA          Social Determinants of Health (SDOH) Interventions     Readmission Risk Interventions No flowsheet data found.

## 2019-04-10 NOTE — Progress Notes (Signed)
Progress Note  Patient Name: Corey Small Date of Encounter: 04/10/2019  Primary Cardiologist: Skeet Latch, MD   Subjective   Denies palpitations, does not feel PACs No CP/SOB No orthostatic sx  Inpatient Medications    Scheduled Meds: . dutasteride  0.5 mg Oral q morning - 10a   Continuous Infusions:  PRN Meds: acetaminophen **OR** acetaminophen, ondansetron **OR** ondansetron (ZOFRAN) IV   Vital Signs    Vitals:   04/08/19 2126 04/09/19 0551 04/09/19 2044 04/10/19 0652  BP: 118/70 (!) 115/49 122/61 117/73  Pulse: 62 66 (!) 53 (!) 54  Resp: 16 17 18 16   Temp: 98.7 F (37.1 C) 98 F (36.7 C) 98.9 F (37.2 C) 97.6 F (36.4 C)  TempSrc: Oral Oral Oral   SpO2: 95% 95% 98% 95%  Weight:  65.9 kg  70.5 kg  Height:        Intake/Output Summary (Last 24 hours) at 04/10/2019 0739 Last data filed at 04/10/2019 0630 Gross per 24 hour  Intake 420 ml  Output 1455 ml  Net -1035 ml   Last 3 Weights 04/10/2019 04/09/2019 04/08/2019  Weight (lbs) 155 lb 6.8 oz 145 lb 4.8 oz 153 lb 1.6 oz  Weight (kg) 70.5 kg 65.908 kg 69.446 kg      Telemetry    SR, S brady to 30s at times, mostly while asleep, - Personally Reviewed  ECG    No new - Personally Reviewed  Physical Exam   General: Well developed, well nourished, male in no acute distress Head: Eyes PERRLA Normocephalic and atraumatic Lungs: Clear bilaterally to auscultation. Heart: HRRR S1 S2, without rub or gallop. No murmur.  Pulses are 2+ & equal. No JVD. Abdomen: Bowel sounds are present, abdomen soft and non-tender without masses or  hernias noted. Msk: Normal strength and tone for age. Extremities: No clubbing, cyanosis or edema.    Skin:  No rashes or lesions noted. Neuro: Alert and oriented X 3. Psych:  Good affect, responds appropriately   Labs    High Sensitivity Troponin:   Recent Labs  Lab 04/05/19 1802 04/05/19 2228  TROPONINIHS 39* 44*      Chemistry Recent Labs  Lab 04/06/19  0617  04/08/19 0651 04/09/19 0340 04/10/19 0330  NA 139   < > 142 141 141  K 4.0   < > 3.8 3.6 3.9  CL 108   < > 115* 115* 113*  CO2 21*   < > 21* 20* 22  GLUCOSE 117*   < > 111* 111* 107*  BUN 64*   < > 51* 42* 32*  CREATININE 3.05*   < > 2.52* 1.75* 1.43*  CALCIUM 8.4*   < > 7.7* 7.6* 7.7*  PROT 5.5*  --   --   --   --   ALBUMIN 3.3*  --   --   --   --   AST 19  --   --   --   --   ALT 16  --   --   --   --   ALKPHOS 67  --   --   --   --   BILITOT 2.0*  --   --   --   --   GFRNONAA 16*   < > 21* 32* 41*  GFRAA 19*   < > 24* 37* 48*  ANIONGAP 10   < > 6 6 6    < > = values in this interval not displayed.  Hematology Recent Labs  Lab 04/08/19 0651 04/08/19 1035 04/09/19 0340  WBC 7.4 7.1 5.7  RBC 3.37* 3.45* 3.12*  HGB 11.0* 11.3* 10.1*  HCT 32.1* 33.2* 29.5*  MCV 95.3 96.2 94.6  MCH 32.6 32.8 32.4  MCHC 34.3 34.0 34.2  RDW 13.5 13.6 13.5  PLT 155 158 140*    BNP Recent Labs  Lab 04/05/19 1803  BNP 65.4     DDimer No results for input(s): DDIMER in the last 168 hours.   Radiology    No results found.  Cardiac Studies   Echo 04/06/19  IMPRESSIONS   1. The left ventricle has normal systolic function with an ejection fraction of 60-65%. The cavity size was normal. Left ventricular diastolic function could not be evaluated secondary to atrial fibrillation. 2. The right ventricle has normal systolic function. The cavity was normal. There is no increase in right ventricular wall thickness. 3. The aortic valve is abnormal. Mild thickening of the aortic valve. Mild calcification of the aortic valve. Aortic valve regurgitation is trivial by color flow Doppler. 4. The aorta is normal unless otherwise noted. 5. The aortic root and ascending aorta are normal in size and structure. 6. The atrial septum is grossly normal.  FINDINGS Left Ventricle: The left ventricle has normal systolic function, with an ejection fraction of 60-65%. The cavity size  was normal. There is no increase in left ventricular wall thickness. Left ventricular diastolic function could not be evaluated  secondary to atrial fibrillation. Definity contrast agent was given IV to delineate the left ventricular endocardial borders.  Right Ventricle: The right ventricle has normal systolic function. The cavity was normal. There is no increase in right ventricular wall thickness.  Left Atrium: Left atrial size was normal in size.  Right Atrium: Right atrial size was normal in size. Right atrial pressure is estimated at 8 mmHg.  Interatrial Septum: The atrial septum is grossly normal.  Pericardium: There is no evidence of pericardial effusion.  Mitral Valve: The mitral valve is normal in structure. Mitral valve regurgitation is trivial by color flow Doppler.  Tricuspid Valve: The tricuspid valve is normal in structure. Tricuspid valve regurgitation was not visualized by color flow Doppler.  Aortic Valve: The aortic valve is abnormal Mild thickening of the aortic valve. Mild calcification of the aortic valve. Aortic valve regurgitation is trivial by color flow Doppler.  Pulmonic Valve: The pulmonic valve was grossly normal. Pulmonic valve regurgitation is not visualized by color flow Doppler.  Aorta: The aortic root and ascending aorta are normal in size and structure. The aorta is normal unless otherwise noted.   Patient Profile     85 y.o. male with a hx of hypertension, hyperlipidemia, prostate cancerand admitted with weakness and dizziness.  Found to be in a flutter and AKI due to dehydration- he was hydrated and placed on coumadin now stopped due to significant hematuria. .  Assessment & Plan    1. Typical atrial flutter:  - no anticoag 2nd significant hematuria on heparin>>ASA only - spontaneous conversion to SR - some SVT, brief, asymptomatic but none in > 24 HR - occ non-conducted PACs but no pauses > 2.5 sec   2. Sinus bradycardia - off  all AV nodal blocking agents, avoid rate-lowering meds - lowest HR are in the 30s, while asleep, no sx - TSH ok - wishes to avoid PPM if possible  3. AKI (dehydration) on CKD III - peak Cr 4.69 - now down to 1.43, no sx overload by  exam - per IM/Renal  4. Hematuria - happened after heparin started - heparin d/c'd - foley in place, urine is clear - Hgb 12.8>>10.1 as of 08/25 -  per IM/Renal  5. Hx prostate CA - per GU   For questions or updates, please contact [place] Please consult [email or web address] for contact info under      Signed, Rosaria Ferries, PA-C  04/10/2019, 7:39 AM

## 2019-04-10 NOTE — NC FL2 (Signed)
  [place] LEVEL OF CARE SCREENING TOOL     IDENTIFICATION  Patient Name: Corey Small Birthdate: 1919/12/11 Sex: male Admission Date (Current Location): 04/05/2019  [facility] [place] Number:  Herbalist and Address:  [facility] [place], [place] [address] [place], [place] [address]      Provider Number: [id number]  Attending Physician Name and Address:  Tawni Millers  Relative Name and Phone Number:       Current Level of Care: Hospital Recommended Level of Care: [place] Prior Approval Number:    Date Approved/Denied:   PASRR Number: [id number]  Discharge Plan: SNF    Current Diagnoses: Patient Active Problem List   Diagnosis Date Noted  . Pressure injury of skin 04/07/2019  . AKI (acute kidney injury) ([place]) 04/06/2019  . Essential hypertension 04/06/2019  . ARF (acute renal failure) ([place]) 04/06/2019  . Atrial flutter ([place]) 04/05/2019  . DIVERTICULOSIS OF COLON 10/06/2009  . COLONIC POLYPS, ADENOMATOUS, HX OF 10/06/2009  . GASTROINTESTINAL HEMORRHAGE, HX OF 10/06/2009    Orientation RESPIRATION BLADDER Height & Weight     Self, Place  Normal Indwelling catheter, Incontinent(placed 8/23) Weight: 155 lb 6.8 oz (70.5 kg) Height:  5\' 11"  (180.3 cm)  BEHAVIORAL SYMPTOMS/MOOD NEUROLOGICAL BOWEL NUTRITION STATUS      Continent Diet(regular diet)  AMBULATORY STATUS COMMUNICATION OF NEEDS Skin   Extensive Assist Verbally PU Stage and Appropriate Care   PU Stage 2 Dressing: (located on sacrum, foam dressing, change PRN)                   Personal Care Assistance Level of Assistance  Bathing, Feeding, Dressing Bathing Assistance: Maximum assistance Feeding assistance: Limited assistance Dressing Assistance: Maximum assistance     Functional Limitations Info  Sight, Hearing, Speech Sight Info: Adequate Hearing Info: Adequate Speech Info: Adequate    SPECIAL CARE FACTORS FREQUENCY  PT  (By licensed PT), OT (By licensed OT)     PT Frequency: 5x OT Frequency: 5x            Contractures Contractures Info: Not present    Additional Factors Info  Code Status, Allergies Code Status Info: DNR Allergies Info: no known allergies           Current Medications (04/10/2019):  This is the current hospital active medication list Current Facility-Administered Medications  Medication Dose Route Frequency Provider Last Rate Last Dose  . acetaminophen (TYLENOL) tablet 650 mg  650 mg Oral Q6H PRN Rise Patience, MD       Or  . acetaminophen (TYLENOL) suppository 650 mg  650 mg Rectal Q6H PRN Rise Patience, MD      . dutasteride (AVODART) capsule 0.5 mg  0.5 mg Oral q morning - 10a Kc, Ramesh, MD   0.5 mg at 04/10/19 0943  . ondansetron (ZOFRAN) tablet 4 mg  4 mg Oral Q6H PRN Rise Patience, MD       Or  . ondansetron [facility]) injection 4 mg  4 mg Intravenous Q6H PRN Rise Patience, MD         Discharge Medications: Please see discharge summary for a list of discharge medications.  Relevant Imaging Results:  Relevant Lab Results:   Additional Information SSN:[id number]  Gerrianne Scale Arlethia Basso, LCSW

## 2019-04-10 NOTE — Discharge Summary (Addendum)
Physician Discharge Summary  Corey Small CZY:[id number] DOB: Feb 10, 1920 DOA: 04/05/2019  PCP: Lorene Dy, MD  Admit date: 04/05/2019 Discharge date: 04/10/2019  Admitted From: SNF Disposition:  SNF   Recommendations for Outpatient Follow-up and new medication changes:  1. Follow up with Dr. Mancel Bale in 7 days.  2. Holding anticoagulation due to hematuria. 3. Follow up with Urology in 14 days.  4. Continue holding lisinopril and hydrochlorothiazide to prevent hypotension.  Home Health: na   Equipment/Devices: na   Discharge Condition: stable  CODE STATUS: DNR   Diet recommendation:  Heart healthy   Brief/Interim Summary: 85 year old male who presented with generalized weakness and dizziness.  He does have significant past medical history for hypertension, dyslipidemia prostate cancer, ckd stage IV.  Patient reported feeling weak and dizzy for about 4 days, worsening symptoms while standing, no chest pain or dyspnea.  EMS was called to his home, he was found hypotensive and received 500 cc of IV normal saline.  In the emergency department his blood pressure was 109/68, heart rate 80, respiratory rate 15, temperature 98.7, oxygen saturation 91% his lungs were clear to auscultation bilaterally, heart S1-S2 present, abdomen soft and nontender, no lower extremity edema. Sodium 141, potassium 5.2, chloride 110, bicarb 20, glucose 124, BUN 68, creatinine 3.34, magnesium 2.2, troponin 39, white count 8.5, hemoglobin 14.9, hematocrit 44.4, platelets 182.  SARS COVID-19 was negative.  His urinalysis had 6-10 white cells, more than 50 red cells.  Chest x-ray had hyperinflation, bibasilar atelectasis, no infiltrates.  EKG 104 bpm, left axis deviation, atrial flutter 3-1 block, J-point elevation V2 to V3, no T wave changes.  Poor R wave progression.  Patient was admitted to the hospital working diagnosis of acute kidney injury complicated by new onset atrial flutter.  1.  Acute kidney injury on  chronic kidney disease stage IV.  Patient was admitted to the medical ward, he was placed on a telemetry monitor, received isotonic saline and hypertensive medications were held.  His kidney function improved with a discharge creatinine of 1.43, sodium 141, potassium 3.9, chloride 113, bicarb 22, BUN 32.  Will continue to avoid nephrotoxic agents and hypotension.  Continue holding hydrochlorothiazide and lisinopril.  2.  New onset of paroxysmal atrial flutter.  Patient was admitted to the cardiac telemetry unit, he converted to sinus rhythm.  Patient showed frequent PVCs and PACs, including blocked PACs.  Patient remained asymptomatic.  Cardiology recommended avoid AV blockade.  No indication for pacemaker currently.  Initially patient was placed on intravenous heparin, complicated by hematuria.  No further anticoagulation recommended due to risk of recurrent bleeding.  3.  Hematuria likely from left kidney stone complicated with acute blood loss anemia.  Patient required bladder catheter, indwelling Foley.  Anticoagulation was held, urology was consulted.  Patient's urine cleared and Foley catheter will be removed before patient's discharge.  Apparently patient does have a left kidney stone which likely is the source of bleeding.  Patient will need follow-up as an outpatient with urology.  His Hgb dropped from 12,8 to 10,1, he did note need prbc transfusion.   4.  Hypertension.  Antihypertensive agents will be held, his discharge blood pressure 117/73 mmHg.   5.  Stage II pressure ulcer at the sacrum.  Present on admission.  Continue local wound care.  6. Dyslipidemia. Continue with simvastatin.   7. Hx of prostate cancer with BPH. Continue dutasteride.   Discharge Diagnoses:  Principal Problem:   Atrial flutter ([place]) Active Problems:   AKI (  acute kidney injury) ([place])   Essential hypertension   ARF (acute renal failure) ([facility])   Pressure injury of skin    Discharge  Instructions   Allergies as of 04/10/2019   No Known Allergies     Medication List    STOP taking these medications   lisinopril 2.5 MG tablet Commonly known as: ZESTRIL   triamterene-hydrochlorothiazide 37.5-25 MG tablet Commonly known as: MAXZIDE-25     TAKE these medications   dutasteride 0.5 MG capsule Commonly known as: AVODART Take 0.5 mg by mouth every morning.   simvastatin 20 MG tablet Commonly known as: ZOCOR Take 20 mg by mouth every morning.       No Known Allergies  Consultations:  Cardiology   Nephrology    Procedures/Studies: US Renal  Result Date: 04/07/2019 CLINICAL DATA:  Acute kidney injury. EXAM: RENAL / URINARY TRACT ULTRASOUND COMPLETE COMPARISON:  None. FINDINGS: Right Kidney: Renal measurements: 10.2 x 5.2 x 5.0 cm = volume: 140 mL. Cortical atrophy present. No hydronephrosis at the level of the kidney. The renal pelvis and proximal ureter may potentially be dilated. Left Kidney: Renal measurements: 8.9 x 7.7 x 4.0 cm = volume: 147 mL. Echogenic cortex with loss of corticomedullary differentiation. Interpolar exophytic simple cyst measures 3 cm. No hydronephrosis. Bladder: Moderately distended bladder demonstrates trabeculated wall with potentially a small amount of intraluminal debris. IMPRESSION: 1. Cortical atrophy of right kidney with potential component of dilatation of the renal pelvis and proximal ureter. 2. Echogenic left kidney with 3 cm interpolar simple cyst. 3. Moderately distended trabeculated bladder with potential small amount of intraluminal debris. Electronically Signed   By: Aletta Edouard M.D.   On: 04/07/2019 14:12   Dg Chest Portable 1 View  Result Date: 04/05/2019 CLINICAL DATA:  Reason for exam: sob Patient denies any sob or chest pains. Slight cough. Reports weakness for 3 days, and loss of appetite. No prior heart or lung conditions, hx of cancer unspecified type. No prior heart or lung surgeries. Non.*comment was  truncated* EXAM: PORTABLE CHEST 1 VIEW COMPARISON:  08/16/2013 FINDINGS: Heart size is normal. Aorta is tortuous. There are no focal consolidations or pleural effusions. No pulmonary edema. IMPRESSION: No active disease. Electronically Signed   By: Nolon Nations M.D.   On: 04/05/2019 18:54      Procedures:   Subjective: Patient is feeling better, no chest pain, no dyspnea, no nausea or vomiting, tolerating po well. Hematuria resolved.   Discharge Exam: Vitals:   04/09/19 2044 04/10/19 0652  BP: 122/61 117/73  Pulse: (!) 53 (!) 54  Resp: 18 16  Temp: 98.9 F (37.2 C) 97.6 F (36.4 C)  SpO2: 98% 95%   Vitals:   04/08/19 2126 04/09/19 0551 04/09/19 2044 04/10/19 0652  BP: 118/70 (!) 115/49 122/61 117/73  Pulse: 62 66 (!) 53 (!) 54  Resp: 16 17 18 16   Temp: 98.7 F (37.1 C) 98 F (36.7 C) 98.9 F (37.2 C) 97.6 F (36.4 C)  TempSrc: Oral Oral Oral   SpO2: 95% 95% 98% 95%  Weight:  65.9 kg  70.5 kg  Height:        General: Not in pain or dyspnea  Neurology: Awake and alert, non focal  E ENT: mild pallor, no icterus, oral mucosa moist Cardiovascular: No JVD. S1-S2 present, rhythmic, no gallops, rubs, or murmurs. No lower extremity edema. Pulmonary:  Positive breath sounds bilaterally, adequate air movement, no wheezing, rhonchi or rales. Gastrointestinal. Abdomen with no organomegaly, non tender, no rebound  or guarding Skin. No rashes Musculoskeletal: no joint deformities   The results of significant diagnostics from this hospitalization (including imaging, microbiology, ancillary and laboratory) are listed below for reference.     Microbiology: Recent Results (from the past 240 hour(s))  SARS CORONAVIRUS 2 Nasal Swab Aptima Multi Swab     Status: None   Collection Time: 04/05/19  8:50 PM   Specimen: Aptima Multi Swab; Nasal Swab  Result Value Ref Range Status   SARS Coronavirus 2 NEGATIVE NEGATIVE Final    Comment: (NOTE) SARS-CoV-2 target nucleic acids are NOT  DETECTED. The SARS-CoV-2 RNA is generally detectable in upper and lower respiratory specimens during the acute phase of infection. Negative results do not preclude SARS-CoV-2 infection, do not rule out co-infections with other pathogens, and should not be used as the sole basis for treatment or other patient management decisions. Negative results must be combined with clinical observations, patient history, and epidemiological information. The expected result is Negative. Fact Sheet for Patients: SugarRoll.be Fact Sheet for Healthcare Providers: https://www.woods-mathews.com/ This test is not yet approved or cleared by the [place] FDA and  has been authorized for detection and/or diagnosis of SARS-CoV-2 by FDA under an Emergency Use Authorization (EUA). This EUA will remain  in effect (meaning this test can be used) for the duration of the COVID-19 declaration under Section 56 4(b)(1) of the Act, 21 U.S.C. section 360bbb-3(b)(1), unless the authorization is terminated or revoked sooner. Performed at [facility], [address], [place], [place] [address]      Labs: BNP (last 3 results) Recent Labs    04/05/19 1803  BNP 96.2   Basic Metabolic Panel: Recent Labs  Lab 04/05/19 1802 04/06/19 0617 04/07/19 0318 04/08/19 0651 04/09/19 0340 04/10/19 0330  NA 141 139 140 142 141 141  K 5.2* 4.0 4.5 3.8 3.6 3.9  CL 110 108 111 115* 115* 113*  CO2 20* 21* 19* 21* 20* 22  GLUCOSE 124* 117* 125* 111* 111* 107*  BUN 68* 64* 68* 51* 42* 32*  CREATININE 3.34* 3.05* 4.69* 2.52* 1.75* 1.43*  CALCIUM 8.5* 8.4* 8.3* 7.7* 7.6* 7.7*  MG 2.2  --   --   --   --   --    Liver Function Tests: Recent Labs  Lab 04/06/19 0617  AST 19  ALT 16  ALKPHOS 67  BILITOT 2.0*  PROT 5.5*  ALBUMIN 3.3*   No results for input(s): LIPASE, AMYLASE in the last 168 hours. No results for input(s): AMMONIA in the last 168 hours. CBC: Recent  Labs  Lab 04/05/19 1802 04/06/19 0617 04/07/19 0318 04/07/19 1818 04/08/19 0018 04/08/19 0651 04/08/19 1035 04/09/19 0340  WBC 8.5 8.5 12.5*  --   --  7.4 7.1 5.7  NEUTROABS 6.1 6.7  --   --   --   --   --   --   HGB 14.9 14.4 13.7 12.8* 11.1* 11.0* 11.3* 10.1*  HCT 44.4 42.6 40.8 37.6* 33.0* 32.1* 33.2* 29.5*  MCV 96.5 95.1 95.3  --   --  95.3 96.2 94.6  PLT 182 185 178  --   --  155 158 140*   Cardiac Enzymes: No results for input(s): CKTOTAL, CKMB, CKMBINDEX, TROPONINI in the last 168 hours. BNP: Invalid input(s): POCBNP CBG: Recent Labs  Lab 04/06/19 2121  GLUCAP 122*   D-Dimer No results for input(s): DDIMER in the last 72 hours. Hgb A1c No results for input(s): HGBA1C in the last 72 hours. Lipid  Profile No results for input(s): CHOL, HDL, LDLCALC, TRIG, CHOLHDL, LDLDIRECT in the last 72 hours. Thyroid function studies No results for input(s): TSH, T4TOTAL, T3FREE, THYROIDAB in the last 72 hours.  Invalid input(s): FREET3 Anemia work up No results for input(s): VITAMINB12, FOLATE, FERRITIN, TIBC, IRON, RETICCTPCT in the last 72 hours. Urinalysis    Component Value Date/Time   COLORURINE RED (A) 04/07/2019 2024   APPEARANCEUR TURBID (A) 04/07/2019 2024   LABSPEC  04/07/2019 2024    TEST NOT REPORTED DUE TO COLOR INTERFERENCE OF URINE PIGMENT   PHURINE  04/07/2019 2024    TEST NOT REPORTED DUE TO COLOR INTERFERENCE OF URINE PIGMENT   GLUCOSEU (A) 04/07/2019 2024    TEST NOT REPORTED DUE TO COLOR INTERFERENCE OF URINE PIGMENT   HGBUR (A) 04/07/2019 2024    TEST NOT REPORTED DUE TO COLOR INTERFERENCE OF URINE PIGMENT   BILIRUBINUR (A) 04/07/2019 2024    TEST NOT REPORTED DUE TO COLOR INTERFERENCE OF URINE PIGMENT   KETONESUR (A) 04/07/2019 2024    TEST NOT REPORTED DUE TO COLOR INTERFERENCE OF URINE PIGMENT   PROTEINUR (A) 04/07/2019 2024    TEST NOT REPORTED DUE TO COLOR INTERFERENCE OF URINE PIGMENT   NITRITE (A) 04/07/2019 2024    TEST NOT REPORTED DUE  TO COLOR INTERFERENCE OF URINE PIGMENT   LEUKOCYTESUR (A) 04/07/2019 2024    TEST NOT REPORTED DUE TO COLOR INTERFERENCE OF URINE PIGMENT   Sepsis Labs Invalid input(s): PROCALCITONIN,  WBC,  LACTICIDVEN Microbiology Recent Results (from the past 240 hour(s))  SARS CORONAVIRUS 2 Nasal Swab Aptima Multi Swab     Status: None   Collection Time: 04/05/19  8:50 PM   Specimen: Aptima Multi Swab; Nasal Swab  Result Value Ref Range Status   SARS Coronavirus 2 NEGATIVE NEGATIVE Final    Comment: (NOTE) SARS-CoV-2 target nucleic acids are NOT DETECTED. The SARS-CoV-2 RNA is generally detectable in upper and lower respiratory specimens during the acute phase of infection. Negative results do not preclude SARS-CoV-2 infection, do not rule out co-infections with other pathogens, and should not be used as the sole basis for treatment or other patient management decisions. Negative results must be combined with clinical observations, patient history, and epidemiological information. The expected result is Negative. Fact Sheet for Patients: SugarRoll.be Fact Sheet for Healthcare Providers: https://www.woods-mathews.com/ This test is not yet approved or cleared by the [place] FDA and  has been authorized for detection and/or diagnosis of SARS-CoV-2 by FDA under an Emergency Use Authorization (EUA). This EUA will remain  in effect (meaning this test can be used) for the duration of the COVID-19 declaration under Section 56 4(b)(1) of the Act, 21 U.S.C. section 360bbb-3(b)(1), unless the authorization is terminated or revoked sooner. Performed at [facility], [place] [address] [place], [place] [address]      Time coordinating discharge: 45 minutes  SIGNED:   Tawni Millers, MD  [facility] 04/10/2019, 9:43 AM

## 2019-05-06 ENCOUNTER — Encounter (HOSPITAL_COMMUNITY): Payer: Self-pay

## 2019-05-06 ENCOUNTER — Other Ambulatory Visit: Payer: Self-pay

## 2019-05-06 ENCOUNTER — Ambulatory Visit (HOSPITAL_COMMUNITY)
Admission: RE | Admit: 2019-05-06 | Discharge: 2019-05-06 | Disposition: A | Discharge status: Home / Self Care | Payer: No Typology Code available for payment source | Source: Ambulatory Visit | Attending: Internal Medicine | Admitting: Internal Medicine

## 2019-05-06 ENCOUNTER — Other Ambulatory Visit (HOSPITAL_COMMUNITY): Payer: Self-pay

## 2019-05-06 ENCOUNTER — Encounter (HOSPITAL_COMMUNITY): Payer: Medicare Other

## 2019-05-06 DIAGNOSIS — R1312 Dysphagia, oropharyngeal phase: Secondary | ICD-10-CM | POA: Diagnosis not present

## 2019-05-06 DIAGNOSIS — R1310 Dysphagia, unspecified: Secondary | ICD-10-CM

## 2019-05-08 ENCOUNTER — Encounter (HOSPITAL_COMMUNITY): Payer: Medicare Other

## 2019-06-16 DEATH — deceased

## 2020-02-22 IMAGING — DX PORTABLE CHEST - 1 VIEW
2 series · 2 of 2 positions shown · non-contrast
Comparison: 08/16/2013

CLINICAL DATA: Reason for exam: sob Patient denies any sob or chest
pains. Slight cough. Reports weakness for 3 days, and loss of
appetite. No prior heart or lung conditions, hx of cancer
unspecified type. No prior heart or lung surgeries. Non.*comment
was truncated*

EXAM:
PORTABLE CHEST 1 VIEW

[chest ap (1 of 2)]
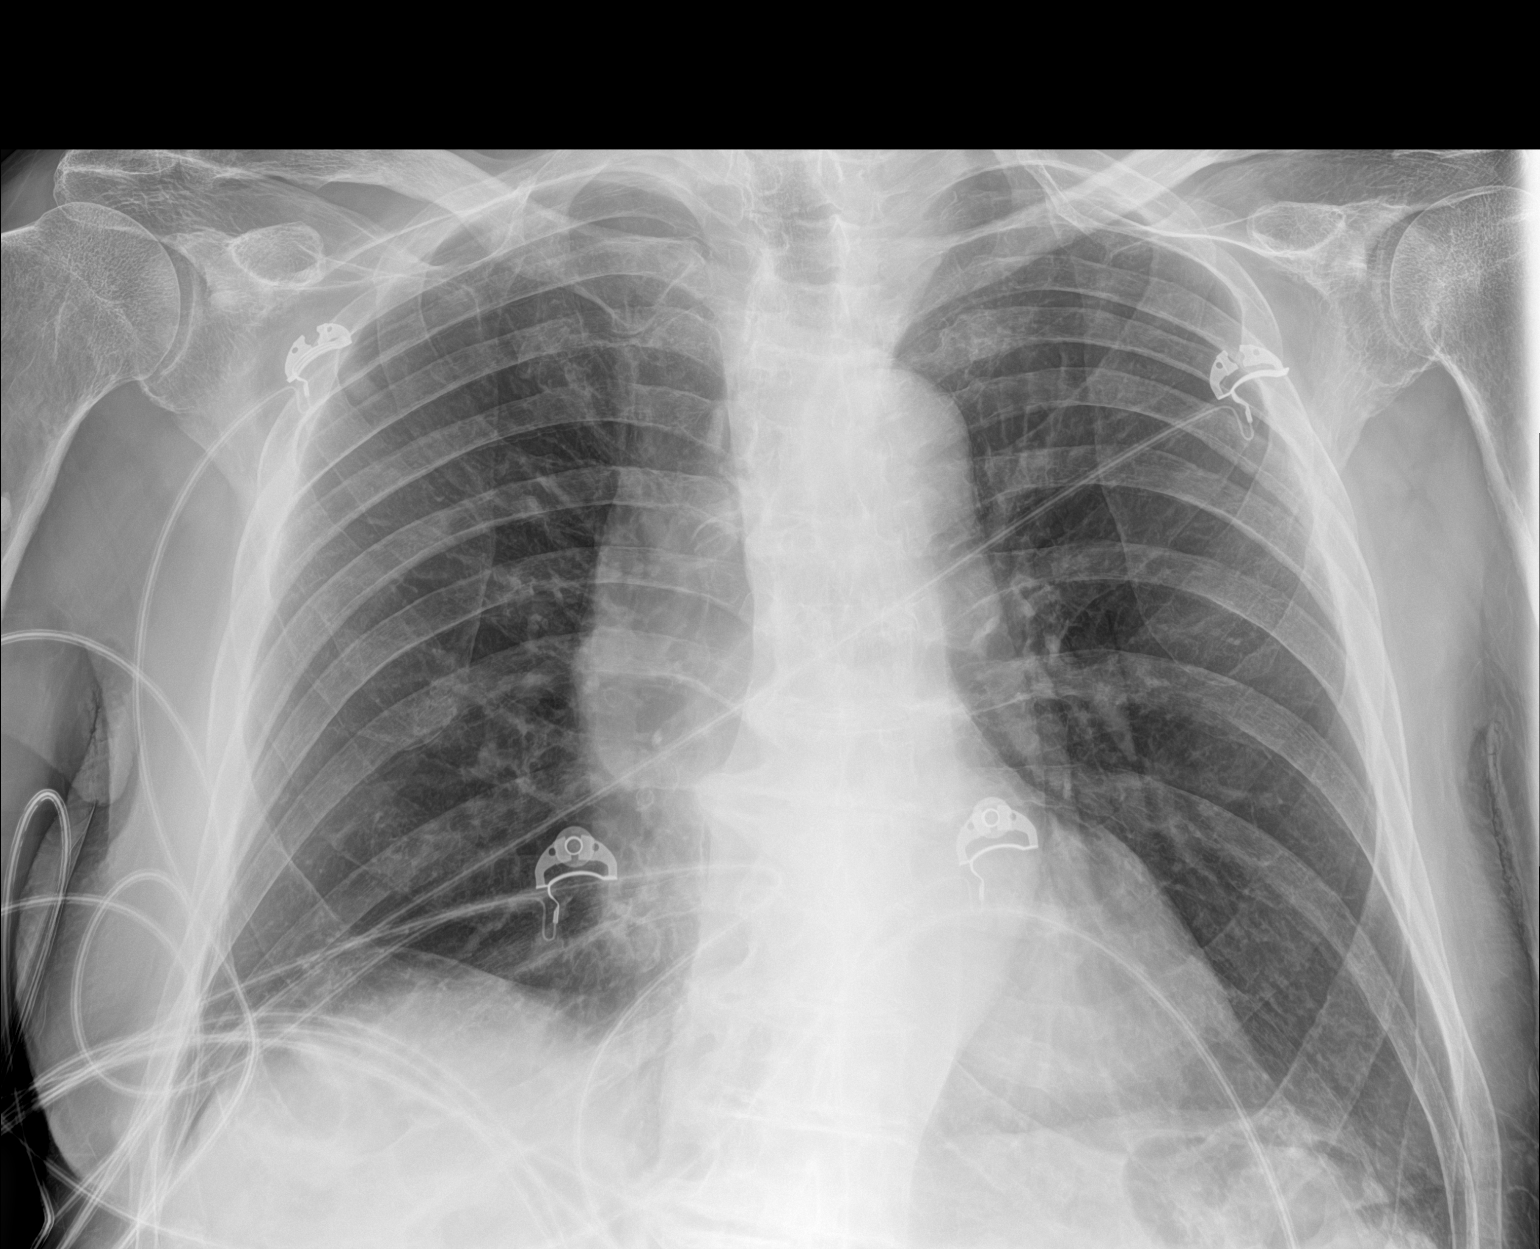

[chest ap (2 of 2)]
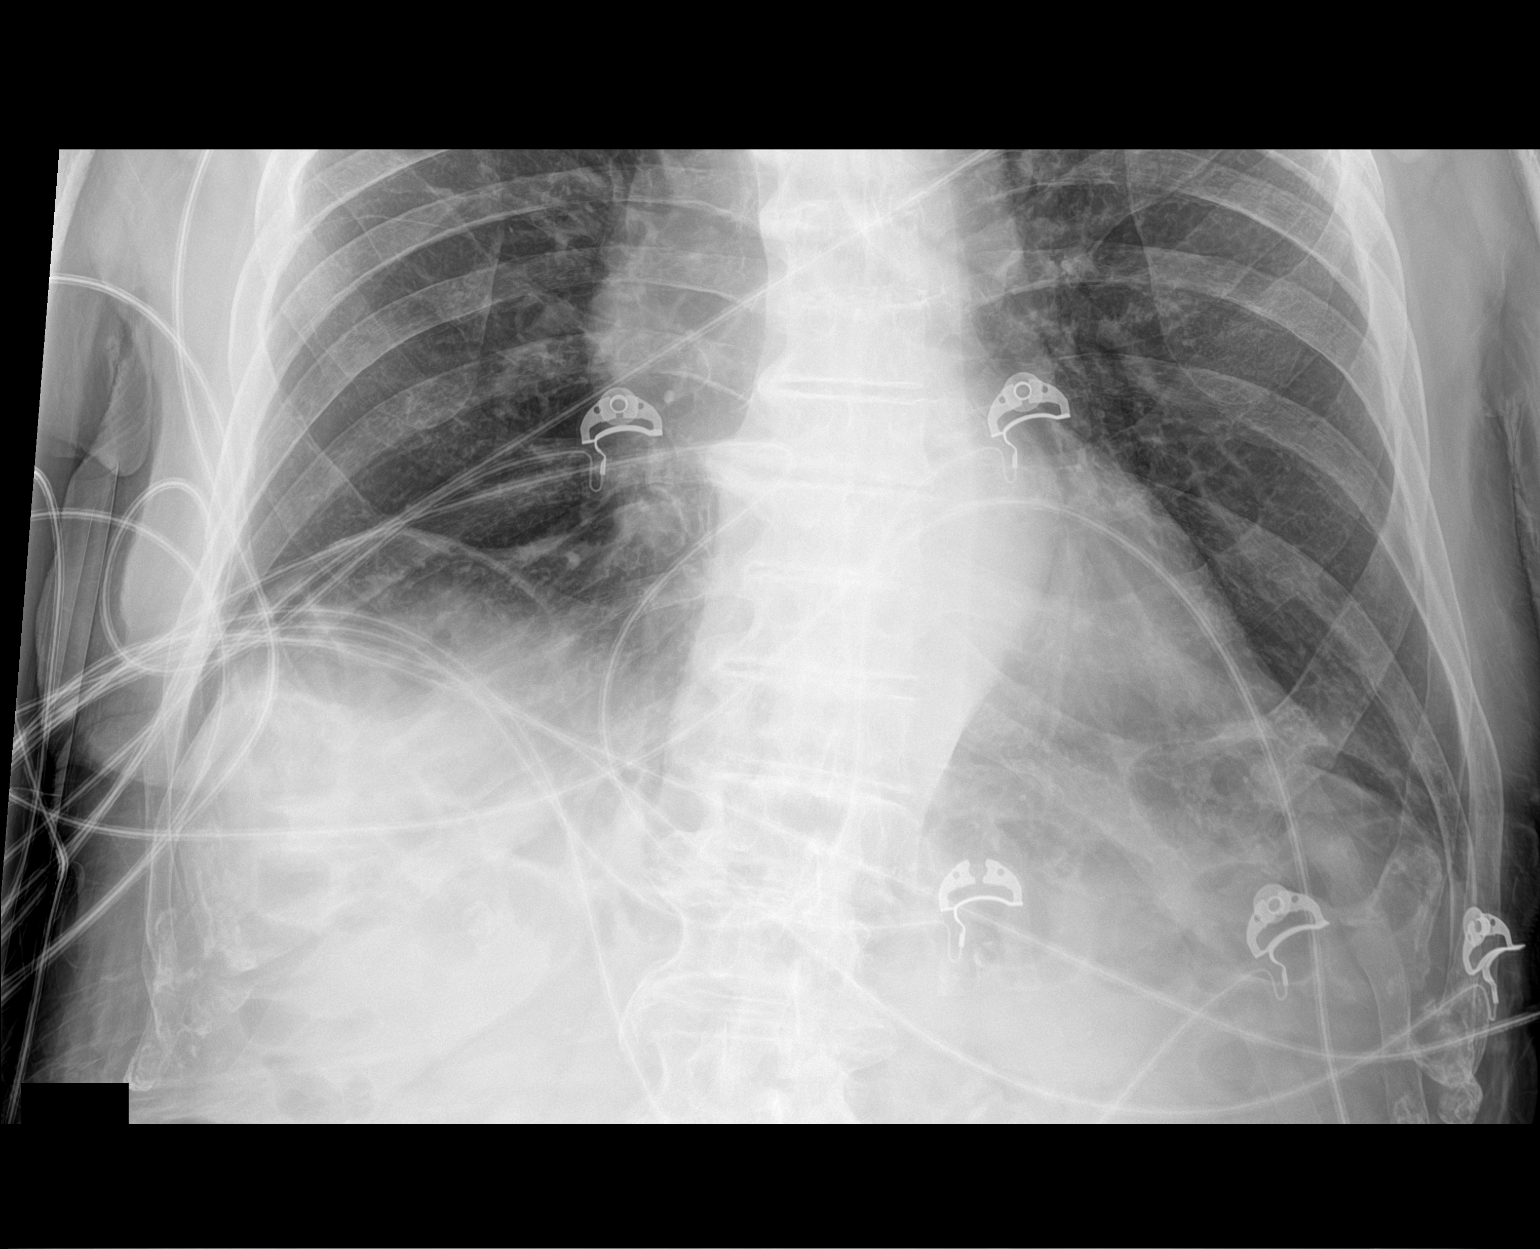

[2 of 2 positions shown; findings below may reference images not displayed]

FINDINGS: Heart size is normal. Aorta is tortuous. There are no focal
consolidations or pleural effusions. No pulmonary edema.
IMPRESSION: No active disease.

## 2020-03-24 IMAGING — RF DG SWALLOWING FUNCTION
12 of 14 series · 12 of 24 positions shown · non-contrast
Comparison: None.

CLINICAL DATA: Dysphagia and history of aspiration pneumonia.

EXAM:
MODIFIED BARIUM SWALLOW
TECHNIQUE: Different consistencies of barium were administered orally to the
patient by the Speech Pathologist. Imaging of the pharynx was
performed in the lateral projection. The radiologist was present in
the fluoroscopy room for this study, providing personal supervision.
FLUOROSCOPY TIME:  Fluoroscopy Time:  1 minutes 42 seconds
Radiation Exposure Index (if provided by the fluoroscopic device):
7.44 mGy
Number of Acquired Spot Images: 0

[Series 1: run · 1 of 22 frames shown (1 of 12)]
[frame 12/22]
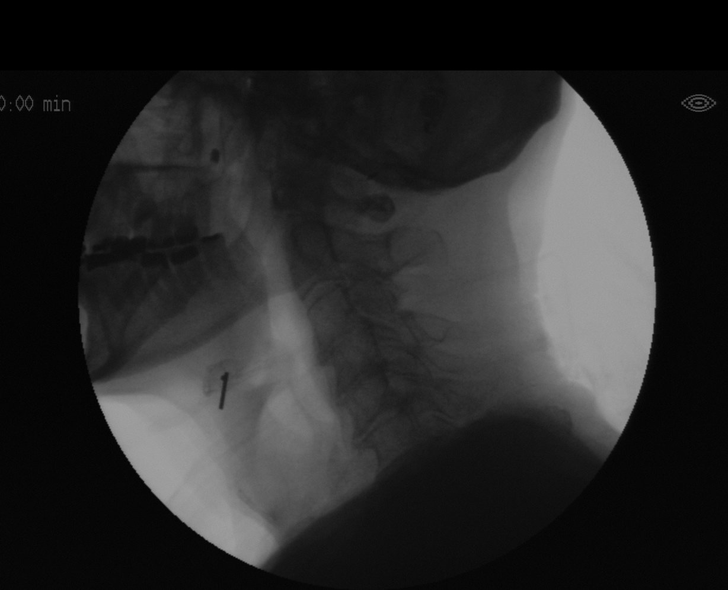

[Series 2: run · 1 of 444 frames shown (2 of 12)]
[frame 378/444]
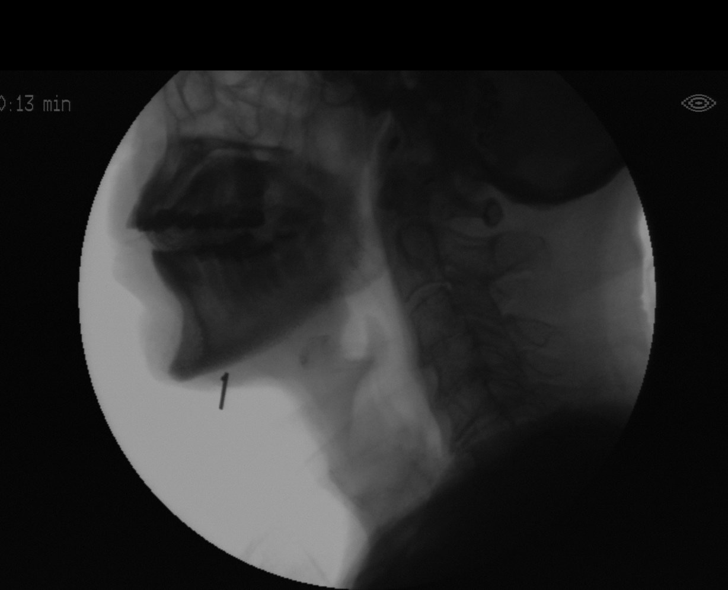

[Series 4: run · 1 of 399 frames shown (3 of 12)]
[frame 60/399]
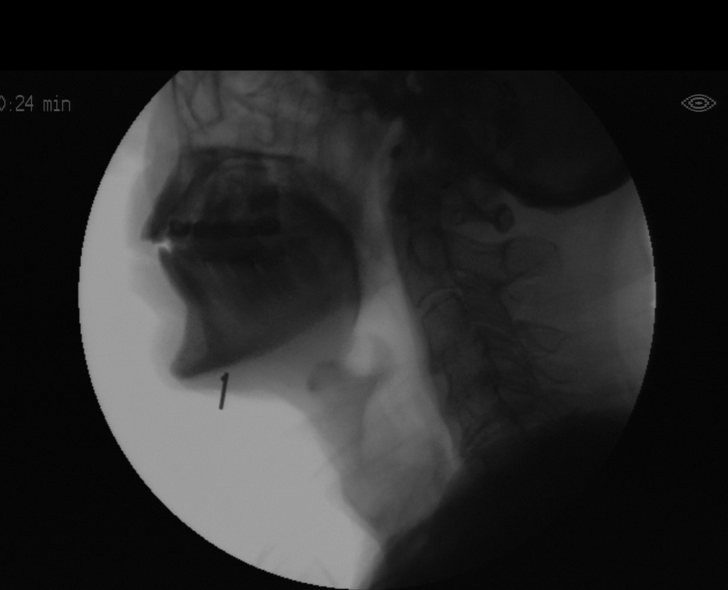

[Series 5: run · 1 of 195 frames shown (4 of 12)]
[frame 30/195]
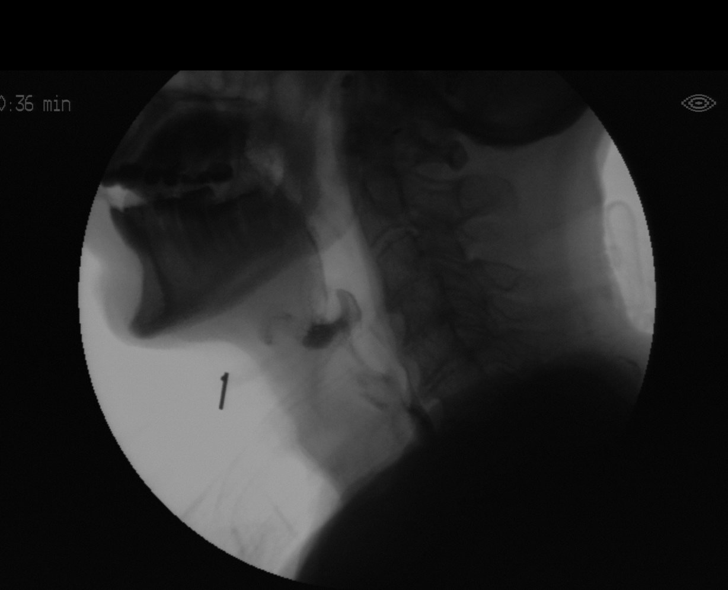

[Series 6: run · 1 of 196 frames shown (5 of 12)]
[frame 167/196]
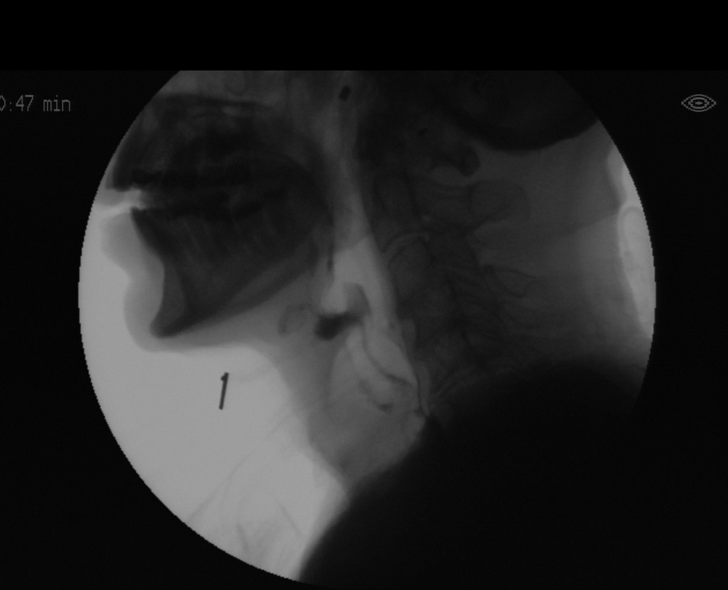

[Series 7: run · 1 of 242 frames shown (6 of 12)]
[frame 122/242]
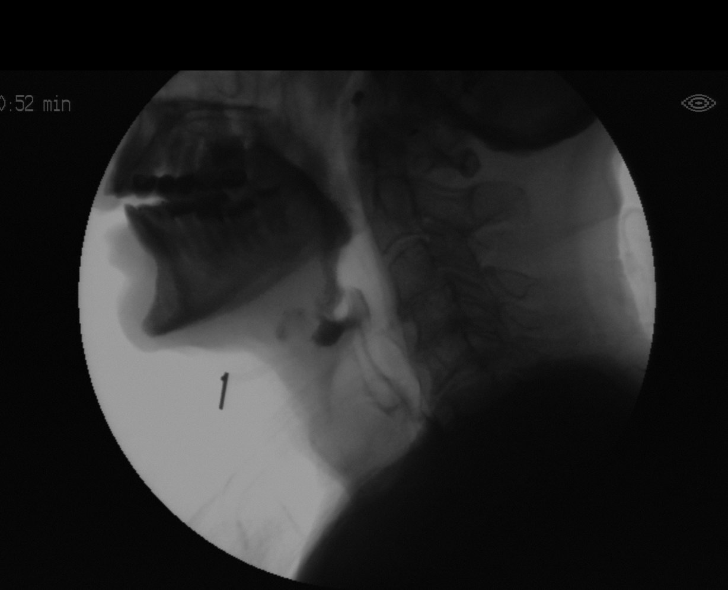

[Series 8: run · 1 of 154 frames shown (7 of 12)]
[frame 131/154]
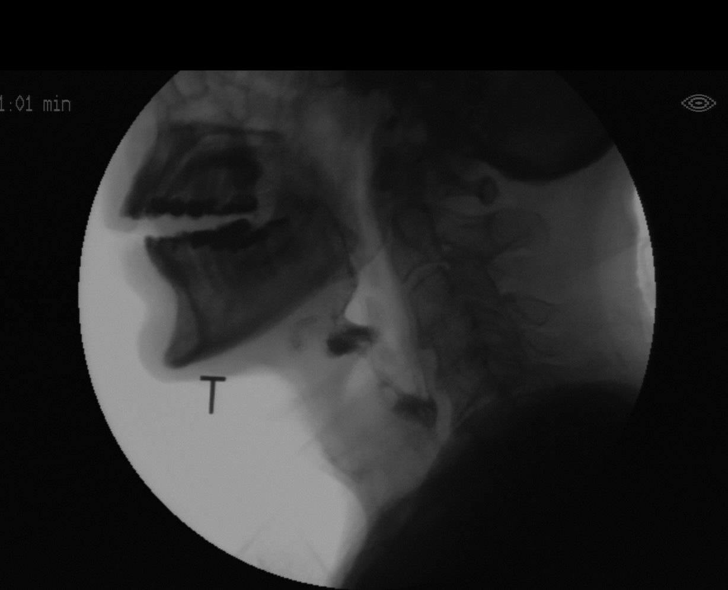

[Series 10: run · 1 of 49 frames shown (8 of 12)]
[frame 8/49]
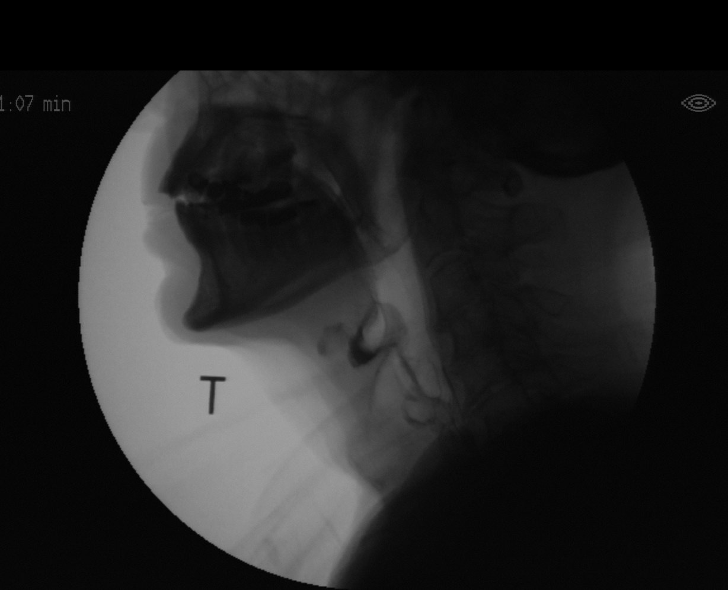

[Series 11: run · 1 of 438 frames shown (9 of 12)]
[frame 220/438]
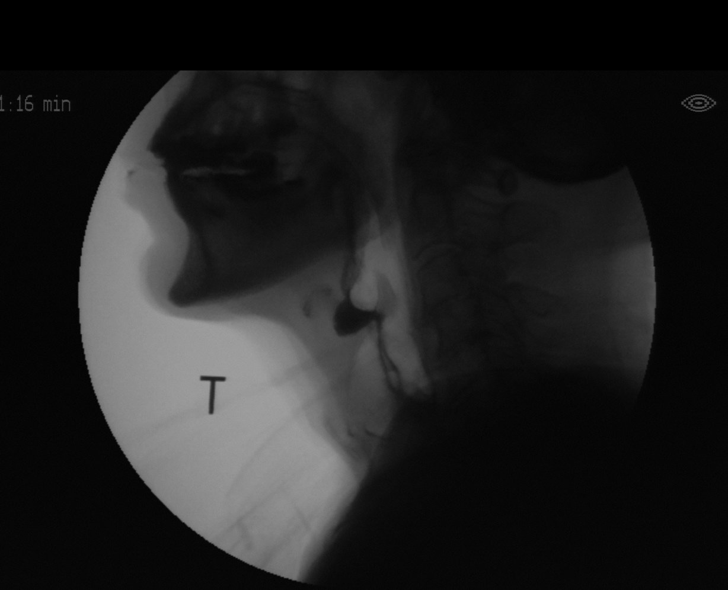

[Series 12: run · 1 of 20 frames shown (10 of 12)]
[frame 4/20]
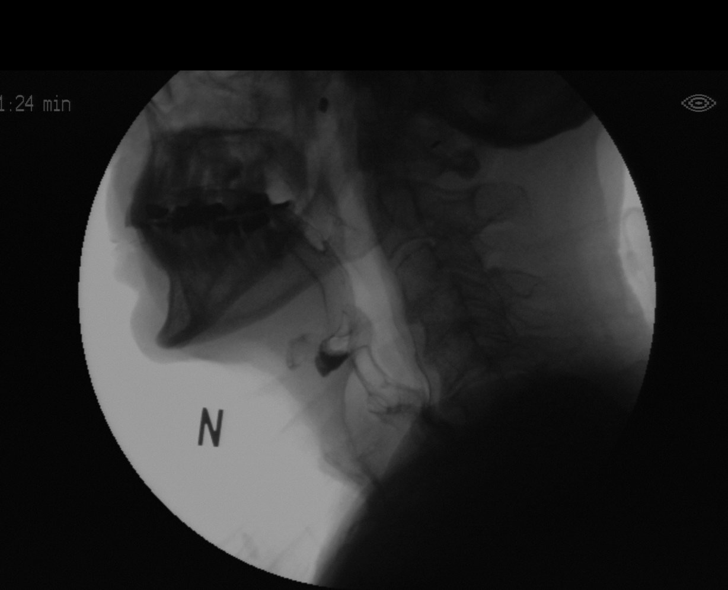

[Series 13: run · 1 of 490 frames shown (11 of 12)]
[frame 247/490]
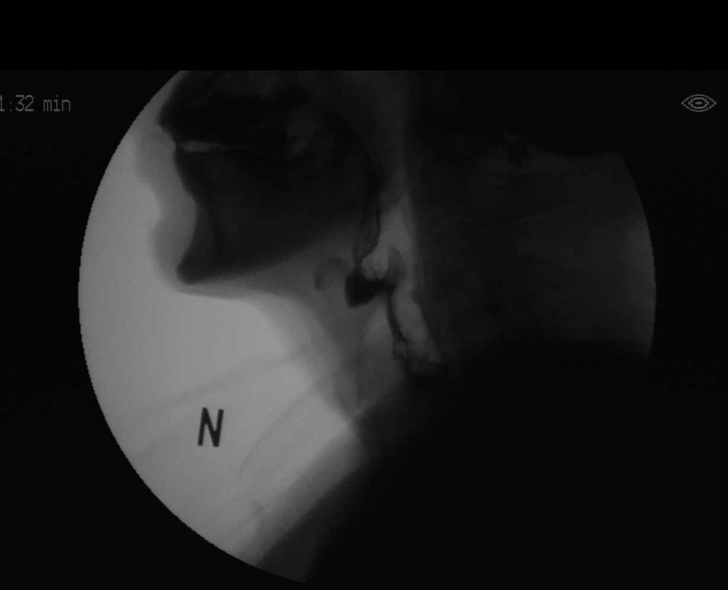

[Series 14: run · 1 of 29 frames shown (12 of 12)]
[frame 25/29]
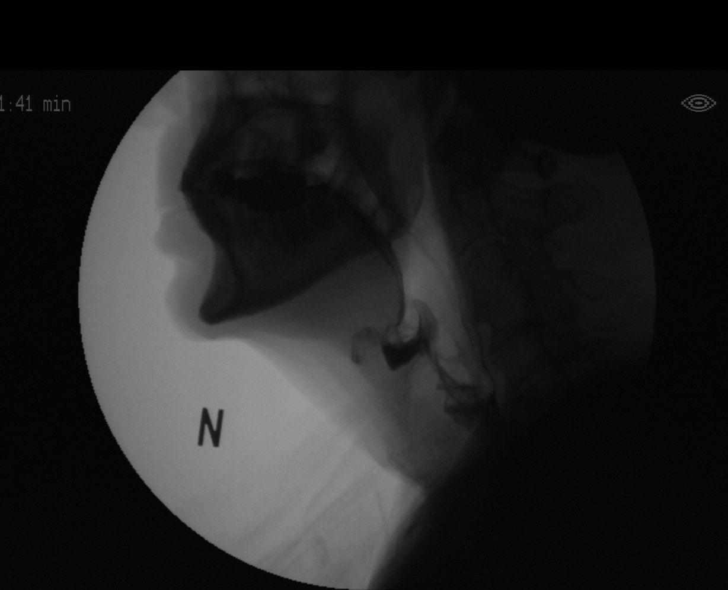

[12 of 24 positions shown; findings below may reference images not displayed]

FINDINGS: A swallow study was obtained with a gradation of bolus size and
consistency from thin liquid to puree. Speech therapy was present
during the exam. There are no morphological abnormalities of the
pharynx. The oral pharyngeal transport is delayed. There is tracheal
aspiration with thin liquids and nectar thickened liquids. There is
laryngeal penetration with puree.
IMPRESSION: Tracheal aspiration with thin liquids and nectar thickened liquids.

Please refer to the Speech Pathologists report for complete details
and recommendations.
